# Patient Record
Sex: Female | Born: 1995
Health system: Southern US, Community
[De-identification: ages and names within clinical notes are randomized; demographics above are authoritative.]

## PROBLEM LIST (undated history)

## (undated) ENCOUNTER — Inpatient Hospital Stay (HOSPITAL_COMMUNITY): Payer: Self-pay

## (undated) DIAGNOSIS — F419 Anxiety disorder, unspecified: Secondary | ICD-10-CM

## (undated) DIAGNOSIS — E162 Hypoglycemia, unspecified: Secondary | ICD-10-CM

## (undated) DIAGNOSIS — F411 Generalized anxiety disorder: Secondary | ICD-10-CM

## (undated) DIAGNOSIS — Z789 Other specified health status: Secondary | ICD-10-CM

## (undated) HISTORY — PX: NO PAST SURGERIES: SHX2092

## (undated) HISTORY — DX: Generalized anxiety disorder: F41.1

## (undated) HISTORY — DX: Hypoglycemia, unspecified: E16.2

## (undated) HISTORY — DX: Anxiety disorder, unspecified: F41.9

---
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
## 2013-01-14 ENCOUNTER — Encounter: Payer: Self-pay | Admitting: Family Medicine

## 2013-01-14 ENCOUNTER — Ambulatory Visit (INDEPENDENT_AMBULATORY_CARE_PROVIDER_SITE_OTHER): Payer: 59 | Admitting: Family Medicine

## 2013-01-14 VITALS — BP 92/64 | Temp 99.3°F | Ht 65.0 in | Wt 135.0 lb

## 2013-01-14 DIAGNOSIS — Z309 Encounter for contraceptive management, unspecified: Secondary | ICD-10-CM

## 2013-01-14 DIAGNOSIS — Z7189 Other specified counseling: Secondary | ICD-10-CM

## 2013-01-14 DIAGNOSIS — IMO0001 Reserved for inherently not codable concepts without codable children: Secondary | ICD-10-CM

## 2013-01-14 DIAGNOSIS — Z7689 Persons encountering health services in other specified circumstances: Secondary | ICD-10-CM

## 2013-01-14 MED ORDER — NORGESTIMATE-ETH ESTRADIOL 0.25-35 MG-MCG PO TABS: 1.0000 | ORAL_TABLET | Freq: Every day | ORAL | Status: DC

## 2013-01-14 NOTE — Progress Notes (Signed)
No chief complaint on file.   HPI:  Danielle [name] is here to establish care.  Last PCP and physical: Dr. Collins ScotlandSpear was prior PCP  Has the following chronic problems and concerns today:  Contraception: -needs refill on sprintec, on this for a few years and takes on regular basis and has not run out yet [facility] -no smoking, migraines, blood clots -not sexually active, takes this to regulate periods  There are no active problems to display for this patient.  Health Maintenance: -UTD  ROS: See pertinent positives and negatives per HPI.  History reviewed. No pertinent past medical history.  Family History  Problem Relation Age of Onset  . Skin cancer Mother     History   Social History  . Marital Status: Single    Spouse Name: N/A    Number of Children: N/A  . Years of Education: N/A   Social History Main Topics  . Smoking status: Never Smoker   . Smokeless tobacco: None  . Alcohol Use: No  . Drug Use: No  . Sexual Activity: No   Other Topics Concern  . 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  GENERAL: vitals reviewed and listed above, alert, oriented, appears well hydrated and in no acute distress  HEENT: atraumatic, conjunttiva clear, no obvious abnormalities on inspection of external nose and ears  NECK: no obvious masses on inspection  MS: moves all extremities without noticeable abnormality  PSYCH: pleasant and cooperative, no obvious depression or  anxiety  ASSESSMENT AND PLAN:  Discussed the following assessment and plan:  Encounter to establish care  Contraception - Plan: norgestimate-ethinyl estradiol (SPRINTEC 28) 0.25-35 MG-MCG tablet  -We reviewed the PMH, PSH, FH, SH, Meds and Allergies. -We provided refills for any medications we will prescribe as needed. -We addressed current concerns per orders and patient instructions. -We have asked for records for pertinent exams, studies, vaccines and notes from previous providers. -We have advised patient to follow up per instructions below. -upreg UTD -refilled OCP after discussion risks and proper use and safe sex -follow up 1 year   -Patient advised to return or notify a doctor immediately if symptoms worsen or persist or new concerns arise.  There are no Patient Instructions on file for this visit.   Kriste BasqueKIM, Danielle [name].

## 2013-01-15 LAB — POCT URINE PREGNANCY: Preg Test, Ur: NEGATIVE

## 2013-01-15 NOTE — Addendum Note (Signed)
Addended by: Azucena FreedMILLNER, Seeley Southgate C on: 01/15/2013 12:21 PM   Modules accepted: Orders

## 2013-12-27 ENCOUNTER — Encounter: Payer: Self-pay | Admitting: Family Medicine

## 2013-12-27 ENCOUNTER — Ambulatory Visit (INDEPENDENT_AMBULATORY_CARE_PROVIDER_SITE_OTHER): Payer: 59 | Admitting: Family Medicine

## 2013-12-27 VITALS — BP 100/70 | HR 85 | Temp 98.5°F | Ht 65.0 in | Wt 130.6 lb

## 2013-12-27 DIAGNOSIS — M545 Low back pain, unspecified: Secondary | ICD-10-CM

## 2013-12-27 DIAGNOSIS — Z23 Encounter for immunization: Secondary | ICD-10-CM

## 2013-12-27 DIAGNOSIS — Z3041 Encounter for surveillance of contraceptive pills: Secondary | ICD-10-CM

## 2013-12-27 MED ORDER — NORGESTIMATE-ETH ESTRADIOL 0.25-35 MG-MCG PO TABS: 1.0000 | ORAL_TABLET | Freq: Every day | ORAL | Status: DC

## 2013-12-27 NOTE — Progress Notes (Signed)
Pre visit review using our clinic review tool, if applicable. No additional management support is needed unless otherwise documented below in the visit note. 

## 2013-12-27 NOTE — Progress Notes (Signed)
  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  No past medical history on file.  No past surgical history on file.  Family History  Problem Relation Age of Onset  . Skin cancer Mother     History   Social History  . Marital Status: Single    Spouse Name: N/A    Number of Children: N/A  . Years of Education: N/A   Social History Main Topics  . Smoking status: Never Smoker   . Smokeless tobacco: None  . Alcohol Use: No  . Drug Use: No  . Sexual Activity: No   Other Topics Concern  . 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  GENERAL: vitals reviewed and listed above, alert, oriented, appears  well hydrated and in no acute distress  HEENT: atraumatic, conjunttiva clear, no obvious abnormalities on inspection of external nose and ears  NECK: no obvious masses on inspection  MS: moves all extremities without noticeable abnormality Normal Gait Normal inspection of back, no obvious scoliosis or leg length descrepancy No bony TTP Soft tissue TTP at: R lower lumbar paraspinal muscles -/+ tests: neg trendelenburg,+facet loading R, -SLRT, -CLRT, -FABER, -FADIR Normal muscle strength, sensation to light touch and DTRs in LEs bilaterally  PSYCH: pleasant and cooperative, no obvious depression or anxiety  ASSESSMENT AND PLAN:  Discussed the following assessment and plan:  Right-sided low back pain without sciatica  Encounter for surveillance of contraceptive pills - Plan: norgestimate-ethinyl estradiol (SPRINTEC 28) 0.25-35 MG-MCG tablet  -HEP, supportive care and close follow up in 4 weeks for likely mild facet arthropathy versus muscle strain in back -refilled contraceptive pills -Patient advised to return or notify a doctor immediately if symptoms worsen or persist or new concerns arise.  Patient Instructions  BEFORE YOU LEAVE: -flu shot -low back exercises -schedule follow up in 4 weeks  Do the exercises at least 4 days per week     Danielle [name] R.

## 2013-12-27 NOTE — Addendum Note (Signed)
Addended by: Johnella MoloneyFUNDERBURK, Aeon Koors A on: 12/27/2013 11:34 AM   Modules accepted: Orders

## 2013-12-27 NOTE — Patient Instructions (Signed)
BEFORE YOU LEAVE: -flu shot -low back exercises -schedule follow up in 4 weeks  Do the exercises at least 4 days per week

## 2014-01-13 ENCOUNTER — Ambulatory Visit (INDEPENDENT_AMBULATORY_CARE_PROVIDER_SITE_OTHER): Payer: 59 | Admitting: Family Medicine

## 2014-01-13 ENCOUNTER — Encounter: Payer: Self-pay | Admitting: Family Medicine

## 2014-01-13 VITALS — BP 100/70 | HR 80 | Temp 97.3°F | Ht 65.75 in | Wt 130.3 lb

## 2014-01-13 DIAGNOSIS — Z30018 Encounter for initial prescription of other contraceptives: Secondary | ICD-10-CM

## 2014-01-13 DIAGNOSIS — M545 Low back pain: Secondary | ICD-10-CM

## 2014-01-13 MED ORDER — NORELGESTROMIN-ETH ESTRADIOL 150-35 MCG/24HR TD PTWK: 1.0000 | MEDICATED_PATCH | TRANSDERMAL | Status: DC

## 2014-01-13 NOTE — Progress Notes (Signed)
  HPI:  Follow up:  1) Back pain -chronic low back pain since childhood -hx horseback riding, heavy lifting in kennel job, fall off horse at age 19 -flares of pain intermittently -flare 12/2013, pain 2-3/10 last visit with normal exam and HEP and conservative care advised -reports: about the same, has very mild back pain when does exercises, has only done the exercises for a few weeks -denies: radiation, weakness, numbness, malaise, bowel or bladder incontinence  2) Contraception: -refilled OCPs last visit -wants to try xulane patch - has not missed pills -denies: smoking, migraine with aura, HTN  ROS: See pertinent positives and negatives per HPI.  No past medical history on file.  No past surgical history on file.  Family History  Problem Relation Age of Onset  . Skin cancer Mother     History   Social History  . Marital Status: Single    Spouse Name: N/A    Number of Children: N/A  . Years of Education: N/A   Social History Main Topics  . Smoking status: Never Smoker   . Smokeless tobacco: None  . Alcohol Use: No  . Drug Use: No  . Sexual Activity: No   Other Topics Concern  . None   Social History Narrative   Work or School: Firefighter - Theatre stage manager, CNA      Home Situation: lives at home when not at school      Spiritual Beliefs: none      Lifestyle: get regular exercise, diet is healthy             Current outpatient prescriptions: norelgestromin-ethinyl estradiol [facility]) 150-35 MCG/24HR transdermal patch, Place 1 patch onto the skin once a week., Disp: 3 patch, Rfl: 12  EXAM:  Filed Vitals:   01/13/14 1036  BP: 100/70  Pulse: 80  Temp: 97.3 F (36.3 C)    Body mass index is 21.19 kg/(m^2).  GENERAL: vitals reviewed and listed above, alert, oriented, appears well hydrated and in no acute distress  HEENT: atraumatic, conjunttiva clear, no obvious abnormalities on inspection of external nose and ears  NECK: no obvious masses on  inspection  LUNGS: clear to auscultation bilaterally, no wheezes, rales or rhonchi, good air movement  CV: HRRR, no peripheral edema  MS: moves all extremities without noticeable abnormality Normal Gait Normal inspection of back, no obvious scoliosis or leg length descrepancy No bony TTP Soft tissue TTP at: lower lumbar paraspinal musculature L >R -/+ tests: neg trendelenburg,-facet loading, -SLRT, -CLRT, -FABER, -FADIR Normal muscle strength, sensation to light touch and DTRs in LEs bilaterally  PSYCH: pleasant and cooperative, no obvious depression or anxiety  ASSESSMENT AND PLAN:  Discussed the following assessment and plan:  Encounter for initial prescription of other contraceptives - Plan: norelgestromin-ethinyl estradiol [facility]) 150-35 MCG/24HR transdermal patch  Low back pain without sciatica, unspecified back pain laterality - Plan: DG Lumbar Spine Complete -plain films, HEP, close follow up ortho or sports med if continues  -Patient advised to return or notify a doctor immediately if symptoms worsen or persist or new concerns arise.  Patient Instructions  BEFORE YOU LEAVE: -xray sheet -schedule follow up in 1 month  Get xrays of the back  Continue exercises for the back       Danielle [name] R.

## 2014-01-13 NOTE — Progress Notes (Signed)
Pre visit review using our clinic review tool, if applicable. No additional management support is needed unless otherwise documented below in the visit note. 

## 2014-01-13 NOTE — Patient Instructions (Signed)
BEFORE YOU LEAVE: -xray sheet -schedule follow up in 1 month  Get xrays of the back  Continue exercises for the back

## 2015-01-12 ENCOUNTER — Other Ambulatory Visit: Payer: Self-pay | Admitting: Family Medicine

## 2015-01-13 MED FILL — XULANE PATCH: 150-35 | 28 days supply | Qty: 3 | Fill #0

## 2015-01-30 ENCOUNTER — Ambulatory Visit (INDEPENDENT_AMBULATORY_CARE_PROVIDER_SITE_OTHER): Payer: 59 | Admitting: Family Medicine

## 2015-01-30 ENCOUNTER — Encounter: Payer: Self-pay | Admitting: Family Medicine

## 2015-01-30 VITALS — BP 100/58 | HR 99 | Temp 98.0°F | Ht 65.75 in | Wt 138.0 lb

## 2015-01-30 DIAGNOSIS — Z30011 Encounter for initial prescription of contraceptive pills: Secondary | ICD-10-CM

## 2015-01-30 MED ORDER — NORGESTIMATE-ETH ESTRADIOL 0.25-35 MG-MCG PO TABS: 1.0000 | ORAL_TABLET | Freq: Every day | ORAL | Status: DC

## 2015-01-30 MED FILL — MONO-LINYAH 28 TABLET: 0.25-35 | 84 days supply | Qty: 84 | Fill #0

## 2015-01-30 NOTE — Patient Instructions (Signed)
BEFORE YOU LEAVE: -schedule PHYSICAL WITH PAP in about 3-4 months  Make sure to take the pill every day at the same time and always use condoms   We recommend the following healthy lifestyle measures: - eat a healthy whole foods diet consisting of regular small meals composed of vegetables, fruits, beans, nuts, seeds, healthy meats such as white chicken and fish and whole grains.  - avoid sweets, white starchy foods, fried foods, fast food, processed foods, sodas, red meet and other fattening foods.  - get a least 150-300 minutes of aerobic exercise per week.

## 2015-01-30 NOTE — Progress Notes (Signed)
Pre visit review using our clinic review tool, if applicable. No additional management support is needed unless otherwise documented below in the visit note. 

## 2015-01-30 NOTE — Progress Notes (Signed)
  HPI:  Follow up:  Contraception: -having allergic reaction on skin to patch - mild, but wants to go back to sprintec -denies: concern for STI, HTN, migraine with aura  ROS: See pertinent positives and negatives per HPI.  No past medical history on file.  No past surgical history on file.  Family History  Problem Relation Age of Onset  . Skin cancer Mother     Social History   Social History  . Marital Status: Single    Spouse Name: N/A  . Number of Children: N/A  . Years of Education: N/A   Social History Main Topics  . Smoking status: Never Smoker   . Smokeless tobacco: None  . Alcohol Use: No  . Drug Use: No  . Sexual Activity: No   Other Topics Concern  . None   Social History Narrative   Work or School: Firefighter - Theatre stage manager, CNA      Home Situation: lives at home when not at school      Spiritual Beliefs: none      Lifestyle: get regular exercise, diet is healthy              Current outpatient prescriptions:  .  norgestimate-ethinyl estradiol (SPRINTEC 28) 0.25-35 MG-MCG tablet, Take 1 tablet by mouth daily., Disp: 3 Package, Rfl: 3  EXAM:  Filed Vitals:   01/30/15 0940  BP: 100/58  Pulse: 99  Temp: 98 F (36.7 C)    Body mass index is 22.44 kg/(m^2).  GENERAL: vitals reviewed and listed above, alert, oriented, appears well hydrated and in no acute distress  HEENT: atraumatic, conjunttiva clear, no obvious abnormalities on inspection of external nose and ears  NECK: no obvious masses on inspection  LUNGS: clear to auscultation bilaterally, no wheezes, rales or rhonchi, good air movement  CV: HRRR, no peripheral edema  MS: moves all extremities without noticeable abnormality  PSYCH: pleasant and cooperative, no obvious depression or anxiety  ASSESSMENT AND PLAN:  Discussed the following assessment and plan:  Encounter for initial prescription of contraceptive pills - Plan: norgestimate-ethinyl estradiol (SPRINTEC 28)  0.25-35 MG-MCG tablet  -discussed possible increased risks VTE with patch versus OCP and am glad she is willing to go back to pill -rx sent -she agrees to schedule physical with pap -declines STI testing -Patient advised to return or notify a doctor immediately if symptoms worsen or persist or new concerns arise.  Patient Instructions  BEFORE YOU LEAVE: -schedule PHYSICAL WITH PAP in about 3-4 months  Make sure to take the pill every day at the same time and always use condoms   We recommend the following healthy lifestyle measures: - eat a healthy whole foods diet consisting of regular small meals composed of vegetables, fruits, beans, nuts, seeds, healthy meats such as white chicken and fish and whole grains.  - avoid sweets, white starchy foods, fried foods, fast food, processed foods, sodas, red meet and other fattening foods.  - get a least 150-300 minutes of aerobic exercise per week.        Kriste Basque R.

## 2015-03-03 ENCOUNTER — Telehealth: Payer: 59 | Admitting: Family

## 2015-03-03 DIAGNOSIS — J1189 Influenza due to unidentified influenza virus with other manifestations: Secondary | ICD-10-CM

## 2015-03-03 DIAGNOSIS — J111 Influenza due to unidentified influenza virus with other respiratory manifestations: Secondary | ICD-10-CM

## 2015-03-03 MED ORDER — OSELTAMIVIR PHOSPHATE 75 MG PO CAPS: 75.0000 mg | ORAL_CAPSULE | Freq: Two times a day (BID) | ORAL | Status: DC

## 2015-03-03 NOTE — Progress Notes (Signed)

## 2015-04-16 MED FILL — MONO-LINYAH 28 TABLET: 0.25-35 | 84 days supply | Qty: 84 | Fill #1

## 2015-05-13 ENCOUNTER — Telehealth: Payer: Self-pay | Admitting: Family Medicine

## 2015-05-13 NOTE — Telephone Encounter (Signed)
Pt would like to know if she could still have a CPE (05/15/15) if it is the last day of her monthly?

## 2015-05-13 NOTE — Telephone Encounter (Signed)
Left message on machine for patient to return call

## 2015-05-14 NOTE — Telephone Encounter (Signed)
I called the pt and informed her of the message below and she stated she prefers to reschedule.  Appt rescheduled for 5/11.

## 2015-05-14 NOTE — Telephone Encounter (Signed)
Should be ok if light bleeding. If she is uncomfortable with it, could reschedule if she wishes.

## 2015-05-15 ENCOUNTER — Encounter: Payer: 59 | Admitting: Family Medicine

## 2015-05-21 ENCOUNTER — Encounter: Payer: Self-pay | Admitting: Family Medicine

## 2015-05-21 ENCOUNTER — Other Ambulatory Visit (HOSPITAL_COMMUNITY)
Admission: RE | Admit: 2015-05-21 | Discharge: 2015-05-21 | Disposition: A | Discharge status: Home / Self Care | Payer: 59 | Source: Ambulatory Visit | Attending: Family Medicine | Admitting: Family Medicine

## 2015-05-21 ENCOUNTER — Ambulatory Visit (INDEPENDENT_AMBULATORY_CARE_PROVIDER_SITE_OTHER): Payer: 59 | Admitting: Family Medicine

## 2015-05-21 VITALS — BP 96/70 | HR 85 | Temp 98.0°F | Ht 65.0 in | Wt 137.3 lb

## 2015-05-21 DIAGNOSIS — Z124 Encounter for screening for malignant neoplasm of cervix: Secondary | ICD-10-CM | POA: Diagnosis not present

## 2015-05-21 DIAGNOSIS — Z0000 Encounter for general adult medical examination without abnormal findings: Secondary | ICD-10-CM | POA: Diagnosis not present

## 2015-05-21 DIAGNOSIS — Z01419 Encounter for gynecological examination (general) (routine) without abnormal findings: Secondary | ICD-10-CM | POA: Diagnosis not present

## 2015-05-21 NOTE — Progress Notes (Signed)
Pre visit review using our clinic review tool, if applicable. No additional management support is needed unless otherwise documented below in the visit note. 

## 2015-05-21 NOTE — Progress Notes (Signed)
HPI:  Here for CPE:  -Concerns and/or follow up today: none  -Diet: variety of foods, balance and well rounded  -Exercise: regular exercise  -Taking folic acid, vitamin D or calcium: no  -Diabetes and Dyslipidemia Screening: n/a  -Hx of HTN: no  -Vaccines: UTD  -pap history: never done  -FDLMP: 05/12/15 - normal, regular, monthly  -sexual activity: yes, female partner, no new partners - one partner; declined STI screening  -wants STI testing (Hep C if born [birth year]): no  -FH breast, colon or ovarian ca: see FH  -Alcohol, Tobacco, drug use: see social history  Review of Systems - no fevers, unintentional weight loss, vision loss, hearing loss, chest pain, sob, hemoptysis, melena, hematochezia, hematuria, genital discharge, changing or concerning skin lesions, bleeding, bruising, loc, thoughts of self harm or SI  No past medical history on file.  No past surgical history on file.  Family History  Problem Relation Age of Onset  . Skin cancer Mother     Social History   Social History  . Marital Status: Single    Spouse Name: N/A  . Number of Children: N/A  . Years of Education: N/A   Social History Main Topics  . Smoking status: Never Smoker   . Smokeless tobacco: None  . Alcohol Use: No  . Drug Use: No  . Sexual Activity: No   Other Topics Concern  . 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  DiphenhydrAMINE HCl (BENADRYL PO), Take by mouth as needed., Disp: , Rfl:  .  norgestimate-ethinyl estradiol (SPRINTEC 28) 0.25-35 MG-MCG tablet, Take 1 tablet by mouth daily., Disp: 3 Package, Rfl: 3  EXAM:  Filed  Vitals:   05/21/15 1123  BP: 96/70  Pulse: 85  Temp: 98 F (36.7 C)    GENERAL: vitals reviewed and listed below, alert, oriented, appears well hydrated and in no acute distress  HEENT: head atraumatic, PERRLA, normal appearance of eyes, ears, nose and mouth. moist mucus membranes.  NECK: supple, no masses or lymphadenopathy  LUNGS: clear to auscultation bilaterally, no rales, rhonchi or wheeze  CV: HRRR, no peripheral edema or cyanosis, normal pedal pulses  BREAST: normal appearance - no lesions or discharge, on palpation normal breast tissue without any suspicious masses  ABDOMEN: bowel sounds normal, soft, non tender to palpation, no masses, no rebound or guarding  GU: normal appearance of external genitalia - no lesions or masses, normal vaginal mucosa - no abnormal discharge, normal appearance of cervix - no lesions or abnormal discharge, no masses or tenderness on palpation of uterus and ovaries.  RECTAL: refused  SKIN: no rash or abnormal lesions   MS: normal gait, moves all extremities normally  NEURO: CN II-XII grossly intact, normal muscle strength and sensation to light touch on extremities  PSYCH: normal affect, pleasant and cooperative  ASSESSMENT AND PLAN:  Discussed the following assessment and plan:  Visit for preventive health examination  Cervical cancer screening - Plan: PAP [[place]]  -Discussed and advised all [place] preventive services health task  force level A and B recommendations for age, sex and risks.  -Advised at least 150 minutes of exercise per week and a healthy diet low in saturated fats and sweets and consisting of fresh fruits and vegetables, lean meats such as fish and white chicken and whole grains.  -pap obtained  No orders of the defined types were placed in this encounter.    Patient advised to return to clinic immediately if symptoms worsen or persist or new concerns.  Patient Instructions  Follow up yearly for  physical.  -We have ordered a pap smear at this visit. It can take up to 1-2 weeks for results and processing. We will contact you with instructions IF your results are abnormal. Normal results will be released to your [facility]. If you have not heard from us or can not find your results in [facility] in 2 weeks please contact our office.  We recommend the following healthy lifestyle measures: - eat a healthy whole foods diet consisting of regular small meals composed of vegetables, fruits, beans, nuts, seeds, healthy meats such as white chicken and fish and whole grains.  - avoid sweets, white starchy foods, fried foods, fast food, processed foods, sodas, red meet and other fattening foods.  - get a least 150-300 minutes of aerobic exercise per week.             No Follow-up on file.  Kriste BasqueKIM, Shay Bartoli R.

## 2015-05-21 NOTE — Patient Instructions (Signed)
Follow up yearly for physical.  -We have ordered a pap smear at this visit. It can take up to 1-2 weeks for results and processing. We will contact you with instructions IF your results are abnormal. Normal results will be released to your [facility]. If you have not heard from us or can not find your results in [facility] in 2 weeks please contact our office.  We recommend the following healthy lifestyle measures: - eat a healthy whole foods diet consisting of regular small meals composed of vegetables, fruits, beans, nuts, seeds, healthy meats such as white chicken and fish and whole grains.  - avoid sweets, white starchy foods, fried foods, fast food, processed foods, sodas, red meet and other fattening foods.  - get a least 150-300 minutes of aerobic exercise per week.

## 2015-05-22 LAB — CYTOLOGY - PAP

## 2015-06-09 DIAGNOSIS — N926 Irregular menstruation, unspecified: Secondary | ICD-10-CM | POA: Diagnosis not present

## 2015-06-25 DIAGNOSIS — H5213 Myopia, bilateral: Secondary | ICD-10-CM | POA: Diagnosis not present

## 2015-06-29 MED FILL — MONO-LINYAH 28 TABLET: 0.25-35 | 84 days supply | Qty: 84 | Fill #2

## 2015-07-07 DIAGNOSIS — Z7689 Persons encountering health services in other specified circumstances: Secondary | ICD-10-CM

## 2015-07-29 ENCOUNTER — Telehealth: Payer: Self-pay | Admitting: Family Medicine

## 2015-07-29 NOTE — Telephone Encounter (Signed)
Pt is requesting the form that was dropped off June 28. Pt needs for clinicals asap.  Form is on bright yellow paper.

## 2015-07-30 NOTE — Telephone Encounter (Signed)
I called the pts mother and let her know that I spoke with the pt on 7/3 and advised her she needed a TB skin test and vision screening and [place] and did not know how she would do this.  I pulled her vaccine history from the state registry and attached this to her form.  Patient's mother states the pt had an eye exam by Dr Lorin PicketScott and she wanted to know if Dr Selena BattenKim could leave a paper blank if the pt gets the TB skin test somewhere else?  Note sent to Dr Selena BattenKim.

## 2015-07-30 NOTE — Telephone Encounter (Signed)
I called the pts mother and informed her per Dr Selena BattenKim she noted on the forms that the pt did not have a vision screening nor TB skin test here as she will have this done somewhere else.  I also advised her someone will call her and let her know about billing costs for the form and she can pick this up tomorrow.

## 2015-07-30 NOTE — Telephone Encounter (Signed)
See form on your desk.

## 2015-08-10 DIAGNOSIS — N911 Secondary amenorrhea: Secondary | ICD-10-CM | POA: Diagnosis not present

## 2015-08-19 DIAGNOSIS — Z3A08 8 weeks gestation of pregnancy: Secondary | ICD-10-CM | POA: Diagnosis not present

## 2015-08-19 DIAGNOSIS — Z3401 Encounter for supervision of normal first pregnancy, first trimester: Secondary | ICD-10-CM | POA: Diagnosis not present

## 2015-08-19 DIAGNOSIS — Z36 Encounter for antenatal screening of mother: Secondary | ICD-10-CM | POA: Diagnosis not present

## 2015-08-19 LAB — OB RESULTS CONSOLE ANTIBODY SCREEN: Antibody Screen: NEGATIVE

## 2015-08-19 LAB — OB RESULTS CONSOLE HEPATITIS B SURFACE ANTIGEN: Hepatitis B Surface Ag: NEGATIVE

## 2015-08-19 LAB — OB RESULTS CONSOLE HIV ANTIBODY (ROUTINE TESTING): HIV: NONREACTIVE

## 2015-08-19 LAB — OB RESULTS CONSOLE ABO/RH: "RH Type ": POSITIVE

## 2015-08-19 LAB — OB RESULTS CONSOLE GC/CHLAMYDIA
Chlamydia: NEGATIVE
Gonorrhea: NEGATIVE

## 2015-08-19 LAB — OB RESULTS CONSOLE RPR: RPR: NONREACTIVE

## 2015-08-19 LAB — OB RESULTS CONSOLE RUBELLA ANTIBODY, IGM: Rubella: IMMUNE

## 2015-09-08 DIAGNOSIS — Z3401 Encounter for supervision of normal first pregnancy, first trimester: Secondary | ICD-10-CM | POA: Diagnosis not present

## 2015-09-08 DIAGNOSIS — Z113 Encounter for screening for infections with a predominantly sexual mode of transmission: Secondary | ICD-10-CM | POA: Diagnosis not present

## 2015-09-21 DIAGNOSIS — Z36 Encounter for antenatal screening of mother: Secondary | ICD-10-CM | POA: Diagnosis not present

## 2015-09-21 DIAGNOSIS — Z3491 Encounter for supervision of normal pregnancy, unspecified, first trimester: Secondary | ICD-10-CM | POA: Diagnosis not present

## 2015-11-03 DIAGNOSIS — Z3400 Encounter for supervision of normal first pregnancy, unspecified trimester: Secondary | ICD-10-CM | POA: Diagnosis not present

## 2015-11-03 DIAGNOSIS — Z363 Encounter for antenatal screening for malformations: Secondary | ICD-10-CM | POA: Diagnosis not present

## 2015-11-03 DIAGNOSIS — Z3A18 18 weeks gestation of pregnancy: Secondary | ICD-10-CM | POA: Diagnosis not present

## 2015-11-27 DIAGNOSIS — Z362 Encounter for other antenatal screening follow-up: Secondary | ICD-10-CM | POA: Diagnosis not present

## 2015-11-27 DIAGNOSIS — Z3400 Encounter for supervision of normal first pregnancy, unspecified trimester: Secondary | ICD-10-CM | POA: Diagnosis not present

## 2015-12-29 DIAGNOSIS — Z23 Encounter for immunization: Secondary | ICD-10-CM | POA: Diagnosis not present

## 2015-12-29 DIAGNOSIS — Z3400 Encounter for supervision of normal first pregnancy, unspecified trimester: Secondary | ICD-10-CM | POA: Diagnosis not present

## 2015-12-29 DIAGNOSIS — Z3480 Encounter for supervision of other normal pregnancy, unspecified trimester: Secondary | ICD-10-CM | POA: Diagnosis not present

## 2016-02-03 DIAGNOSIS — Z3480 Encounter for supervision of other normal pregnancy, unspecified trimester: Secondary | ICD-10-CM | POA: Diagnosis not present

## 2016-02-27 ENCOUNTER — Inpatient Hospital Stay (HOSPITAL_COMMUNITY)
Admission: AD | Admit: 2016-02-27 | Discharge: 2016-02-27 | Disposition: A | Discharge status: Home / Self Care | Payer: 59 | Source: Ambulatory Visit | Attending: Obstetrics and Gynecology | Admitting: Obstetrics and Gynecology

## 2016-02-27 ENCOUNTER — Encounter (HOSPITAL_COMMUNITY): Payer: Self-pay | Admitting: *Deleted

## 2016-02-27 DIAGNOSIS — R109 Unspecified abdominal pain: Secondary | ICD-10-CM

## 2016-02-27 DIAGNOSIS — Z3A35 35 weeks gestation of pregnancy: Secondary | ICD-10-CM | POA: Diagnosis not present

## 2016-02-27 DIAGNOSIS — O26893 Other specified pregnancy related conditions, third trimester: Secondary | ICD-10-CM

## 2016-02-27 DIAGNOSIS — N898 Other specified noninflammatory disorders of vagina: Secondary | ICD-10-CM

## 2016-02-27 HISTORY — DX: Other specified health status: Z78.9

## 2016-02-27 LAB — URINALYSIS, ROUTINE W REFLEX MICROSCOPIC
Bilirubin Urine: NEGATIVE
Glucose, UA: NEGATIVE mg/dL
Hgb urine dipstick: NEGATIVE
Ketones, ur: 80 mg/dL — AB
Nitrite: NEGATIVE
Protein, ur: NEGATIVE mg/dL
Specific Gravity, Urine: 1.023 (ref 1.005–1.030)
pH: 6 (ref 5.0–8.0)

## 2016-02-27 LAB — AMNISURE RUPTURE OF MEMBRANE (ROM) NOT AT ARMC: Amnisure ROM: NEGATIVE

## 2016-02-27 NOTE — MAU Note (Signed)
Earlier today, noted pants were wet.  Checked again about an hour later, noted clear wetness.  Having cramping  In abd and low back. Feeling pelvic preessure

## 2016-02-27 NOTE — MAU Provider Note (Signed)
History   G1 @ 35.3 wks in with c/o increased leaking since about lunch time and some mild sporadic cramping. States leaking is just enough to make her panties wet.  CSN: [id number]  Arrival date & time 02/27/16  1707   None     Chief Complaint  Patient presents with  . ?srom  . Abdominal Cramping    HPI  No past medical history on file.  No past surgical history on file.  Family History  Problem Relation Age of Onset  . Skin cancer Mother     Social History  Substance Use Topics  . Smoking status: Never Smoker  . Smokeless tobacco: Not on file  . Alcohol use No    OB History    Gravida Para Term Preterm AB Living   1             SAB TAB Ectopic Multiple Live Births                  Review of Systems  Constitutional: Negative.   HENT: Negative.   Eyes: Negative.   Respiratory: Negative.   Cardiovascular: Negative.   Gastrointestinal: Positive for abdominal pain.  Endocrine: Negative.   Genitourinary: Positive for vaginal discharge.  Musculoskeletal: Negative.   Skin: Negative.     Allergies  Patient has no known allergies.  Home Medications    BP 114/70 (BP Location: Right Arm)   Pulse (!) 127   Temp 98.2 F (36.8 C) (Oral)   Resp 18   Wt 157 lb 3.2 oz (71.3 kg)   LMP 05/12/2015   BMI 26.16 kg/m   Physical Exam  Constitutional: She is oriented to person, place, and time. She appears well-developed and well-nourished.  HENT:  Head: Normocephalic.  Eyes: Pupils are equal, round, and reactive to light.  Neck: Normal range of motion.  Cardiovascular: Normal rate, regular rhythm, normal heart sounds and intact distal pulses.   Pulmonary/Chest: Effort normal and breath sounds normal.  Abdominal: Soft. Bowel sounds are normal.  Genitourinary: Vagina normal and uterus normal.  Musculoskeletal: Normal range of motion.  Neurological: She is alert and oriented to person, place, and time. She has normal reflexes.  Skin: Skin is warm and dry.   Psychiatric: She has a normal mood and affect. Her behavior is normal. Judgment and thought content normal.    MAU Course  Procedures (including critical care time)  Labs Reviewed  AMNISURE RUPTURE OF MEMBRANE (ROM) NOT AT [facility]  URINALYSIS, ROUTINE W REFLEX MICROSCOPIC   No results found.   No diagnosis found.    MDM  amnisure neg, FHR pattern reassuring, no uc's, VSS, SVE cl/post/th/high. POC discussed with Dr. Marcelle OverlieHolland. Pt to be d/c home

## 2016-02-27 NOTE — Discharge Instructions (Signed)
Abdominal Pain During Pregnancy °Belly (abdominal) pain is common during pregnancy. Most of the time, it is not a serious problem. Other times, it can be a sign that something is wrong with the pregnancy. Always tell your doctor if you have belly pain. °Follow these instructions at home: °Monitor your belly pain for any changes. The following actions may help you feel better: °· Do not have sex (intercourse) or put anything in your vagina until you feel better. °· Rest until your pain stops. °· Drink clear fluids if you feel sick to your stomach (nauseous). Do not eat solid food until you feel better. °· Only take medicine as told by your doctor. °· Keep all doctor visits as told. °Get help right away if: °· You are bleeding, leaking fluid, or pieces of tissue come out of your vagina. °· You have more pain or cramping. °· You keep throwing up (vomiting). °· You have pain when you pee (urinate) or have blood in your pee. °· You have a fever. °· You do not feel your baby moving as much. °· You feel very weak or feel like passing out. °· You have trouble breathing, with or without belly pain. °· You have a very bad headache and belly pain. °· You have fluid leaking from your vagina and belly pain. °· You keep having watery poop (diarrhea). °· Your belly pain does not go away after resting, or the pain gets worse. °This information is not intended to replace advice given to you by your health care provider. Make sure you discuss any questions you have with your health care provider. °Document Released: 12/15/2008 Document Revised: 08/05/2015 Document Reviewed: 07/26/2012 °Elsevier Interactive Patient Education © 2017 [employer] ° °

## 2016-02-29 DIAGNOSIS — Z3400 Encounter for supervision of normal first pregnancy, unspecified trimester: Secondary | ICD-10-CM | POA: Diagnosis not present

## 2016-02-29 DIAGNOSIS — Z113 Encounter for screening for infections with a predominantly sexual mode of transmission: Secondary | ICD-10-CM | POA: Diagnosis not present

## 2016-02-29 DIAGNOSIS — N760 Acute vaginitis: Secondary | ICD-10-CM | POA: Diagnosis not present

## 2016-02-29 DIAGNOSIS — Z3480 Encounter for supervision of other normal pregnancy, unspecified trimester: Secondary | ICD-10-CM | POA: Diagnosis not present

## 2016-03-01 MED FILL — metroNIDAZOLE 0.75 % GEL: 0.75 | 5 days supply | Qty: 70 | Fill #0

## 2016-03-23 DIAGNOSIS — Z3400 Encounter for supervision of normal first pregnancy, unspecified trimester: Secondary | ICD-10-CM | POA: Diagnosis not present

## 2016-03-23 DIAGNOSIS — Z3A39 39 weeks gestation of pregnancy: Secondary | ICD-10-CM | POA: Diagnosis not present

## 2016-03-23 DIAGNOSIS — O368130 Decreased fetal movements, third trimester, not applicable or unspecified: Secondary | ICD-10-CM | POA: Diagnosis not present

## 2016-03-23 LAB — OB RESULTS CONSOLE GBS: GBS: NEGATIVE

## 2016-03-30 ENCOUNTER — Telehealth (HOSPITAL_COMMUNITY): Payer: Self-pay | Admitting: *Deleted

## 2016-03-30 ENCOUNTER — Encounter (HOSPITAL_COMMUNITY): Payer: Self-pay | Admitting: *Deleted

## 2016-03-30 NOTE — Telephone Encounter (Signed)
Preadmission screen  

## 2016-03-31 ENCOUNTER — Inpatient Hospital Stay (HOSPITAL_COMMUNITY)
Admission: AD | Admit: 2016-03-31 | Discharge: 2016-04-02 | DRG: 775 | Disposition: A | Discharge status: Home / Self Care | Payer: 59 | Source: Ambulatory Visit | Attending: Obstetrics and Gynecology | Admitting: Obstetrics and Gynecology

## 2016-03-31 ENCOUNTER — Inpatient Hospital Stay (HOSPITAL_COMMUNITY): Payer: 59 | Admitting: Anesthesiology

## 2016-03-31 ENCOUNTER — Encounter (HOSPITAL_COMMUNITY): Payer: Self-pay

## 2016-03-31 DIAGNOSIS — Z3493 Encounter for supervision of normal pregnancy, unspecified, third trimester: Secondary | ICD-10-CM | POA: Diagnosis present

## 2016-03-31 DIAGNOSIS — Z3A40 40 weeks gestation of pregnancy: Secondary | ICD-10-CM

## 2016-03-31 LAB — CBC
HCT: 40.2 % (ref 36.0–46.0)
Hemoglobin: 13.7 g/dL (ref 12.0–15.0)
MCH: 30.2 pg (ref 26.0–34.0)
MCHC: 34.1 g/dL (ref 30.0–36.0)
MCV: 88.7 fL (ref 78.0–100.0)
Platelets: 157 10*3/uL (ref 150–400)
RBC: 4.53 MIL/uL (ref 3.87–5.11)
RDW: 13.8 % (ref 11.5–15.5)
WBC: 12.1 10*3/uL — ABNORMAL HIGH (ref 4.0–10.5)

## 2016-03-31 LAB — TYPE AND SCREEN
ABO/RH(D): O POS
Antibody Screen: NEGATIVE

## 2016-03-31 LAB — ABO/RH: ABO/RH(D): O POS

## 2016-03-31 MED ORDER — ACETAMINOPHEN 325 MG PO TABS: 650.0000 mg | ORAL_TABLET | ORAL | Status: DC | PRN

## 2016-03-31 MED ORDER — TRANEXAMIC ACID 650 MG PO TABS
1300.0000 mg | ORAL_TABLET | Freq: Once | ORAL | Status: AC
  Administered 2016-03-31: 1300 mg via ORAL
  Filled 2016-03-31: qty 2

## 2016-03-31 MED ORDER — PHENYLEPHRINE 40 MCG/ML (10ML) SYRINGE FOR IV PUSH (FOR BLOOD PRESSURE SUPPORT)
80.0000 ug | PREFILLED_SYRINGE | INTRAVENOUS | Status: DC | PRN
  Filled 2016-03-31: qty 10
  Filled 2016-03-31: qty 5

## 2016-03-31 MED ORDER — COCONUT OIL OIL: 1.0000 "application " | TOPICAL_OIL | Status: DC | PRN

## 2016-03-31 MED ORDER — METHYLERGONOVINE MALEATE 0.2 MG PO TABS
0.2000 mg | ORAL_TABLET | Freq: Three times a day (TID) | ORAL | Status: AC
  Administered 2016-04-01 (×2): 0.2 mg via ORAL
  Filled 2016-03-31 (×2): qty 1

## 2016-03-31 MED ORDER — OXYTOCIN 40 UNITS IN LACTATED RINGERS INFUSION - SIMPLE MED
1.0000 m[IU]/min | INTRAVENOUS | Status: DC
  Administered 2016-03-31: 2 m[IU]/min via INTRAVENOUS

## 2016-03-31 MED ORDER — EPHEDRINE 5 MG/ML INJ
10.0000 mg | INTRAVENOUS | Status: DC | PRN
  Filled 2016-03-31: qty 2

## 2016-03-31 MED ORDER — FENTANYL 2.5 MCG/ML BUPIVACAINE 1/10 % EPIDURAL INFUSION (WH - ANES)
14.0000 mL/h | INTRAMUSCULAR | Status: DC | PRN
  Administered 2016-03-31: 14 mL/h via EPIDURAL
  Filled 2016-03-31: qty 100

## 2016-03-31 MED ORDER — PHENYLEPHRINE 40 MCG/ML (10ML) SYRINGE FOR IV PUSH (FOR BLOOD PRESSURE SUPPORT)
80.0000 ug | PREFILLED_SYRINGE | INTRAVENOUS | Status: DC | PRN
  Filled 2016-03-31: qty 5

## 2016-03-31 MED ORDER — OXYTOCIN 40 UNITS IN LACTATED RINGERS INFUSION - SIMPLE MED
2.5000 [IU]/h | INTRAVENOUS | Status: AC
  Administered 2016-04-01: 2.5 [IU]/h via INTRAVENOUS
  Filled 2016-03-31: qty 1000

## 2016-03-31 MED ORDER — OXYCODONE-ACETAMINOPHEN 5-325 MG PO TABS: 2.0000 | ORAL_TABLET | ORAL | Status: DC | PRN

## 2016-03-31 MED ORDER — PHENYLEPHRINE 40 MCG/ML (10ML) SYRINGE FOR IV PUSH (FOR BLOOD PRESSURE SUPPORT): 80.0000 ug | PREFILLED_SYRINGE | INTRAVENOUS | Status: DC | PRN

## 2016-03-31 MED ORDER — DIBUCAINE 1 % RE OINT: 1.0000 "application " | TOPICAL_OINTMENT | RECTAL | Status: DC | PRN

## 2016-03-31 MED ORDER — LACTATED RINGERS IV SOLN
INTRAVENOUS | Status: DC
  Administered 2016-03-31 (×2): via INTRAVENOUS

## 2016-03-31 MED ORDER — SOD CITRATE-CITRIC ACID 500-334 MG/5ML PO SOLN: 30.0000 mL | ORAL | Status: DC | PRN

## 2016-03-31 MED ORDER — ONDANSETRON HCL 4 MG/2ML IJ SOLN: 4.0000 mg | Freq: Four times a day (QID) | INTRAMUSCULAR | Status: DC | PRN

## 2016-03-31 MED ORDER — SENNOSIDES-DOCUSATE SODIUM 8.6-50 MG PO TABS
2.0000 | ORAL_TABLET | ORAL | Status: DC
  Administered 2016-04-01 (×2): 2 via ORAL
  Filled 2016-03-31 (×2): qty 2

## 2016-03-31 MED ORDER — ONDANSETRON HCL 4 MG PO TABS
4.0000 mg | ORAL_TABLET | ORAL | Status: DC | PRN
  Administered 2016-04-01: 4 mg via ORAL
  Filled 2016-03-31: qty 1

## 2016-03-31 MED ORDER — DIPHENHYDRAMINE HCL 25 MG PO CAPS: 25.0000 mg | ORAL_CAPSULE | Freq: Four times a day (QID) | ORAL | Status: DC | PRN

## 2016-03-31 MED ORDER — BENZOCAINE-MENTHOL 20-0.5 % EX AERO
1.0000 "application " | INHALATION_SPRAY | CUTANEOUS | Status: DC | PRN
  Filled 2016-03-31: qty 56

## 2016-03-31 MED ORDER — MEDROXYPROGESTERONE ACETATE 150 MG/ML IM SUSP: 150.0000 mg | INTRAMUSCULAR | Status: DC | PRN

## 2016-03-31 MED ORDER — TETANUS-DIPHTH-ACELL PERTUSSIS 5-2.5-18.5 LF-MCG/0.5 IM SUSP: 0.5000 mL | Freq: Once | INTRAMUSCULAR | Status: DC

## 2016-03-31 MED ORDER — MISOPROSTOL 200 MCG PO TABS
ORAL_TABLET | ORAL | Status: AC
  Administered 2016-03-31: 800 ug
  Filled 2016-03-31: qty 4

## 2016-03-31 MED ORDER — PRENATAL MULTIVITAMIN CH
1.0000 | ORAL_TABLET | Freq: Every day | ORAL | Status: DC
  Administered 2016-04-01 – 2016-04-02 (×2): 1 via ORAL
  Filled 2016-03-31 (×2): qty 1

## 2016-03-31 MED ORDER — WITCH HAZEL-GLYCERIN EX PADS: 1.0000 "application " | MEDICATED_PAD | CUTANEOUS | Status: DC | PRN

## 2016-03-31 MED ORDER — LACTATED RINGERS IV SOLN: 500.0000 mL | Freq: Once | INTRAVENOUS | Status: DC

## 2016-03-31 MED ORDER — EPHEDRINE 5 MG/ML INJ: 10.0000 mg | INTRAVENOUS | Status: DC | PRN

## 2016-03-31 MED ORDER — DIPHENHYDRAMINE HCL 50 MG/ML IJ SOLN: 12.5000 mg | INTRAMUSCULAR | Status: DC | PRN

## 2016-03-31 MED ORDER — LIDOCAINE HCL (PF) 1 % IJ SOLN
30.0000 mL | INTRAMUSCULAR | Status: DC | PRN
  Administered 2016-03-31: 30 mL via SUBCUTANEOUS
  Filled 2016-03-31: qty 30

## 2016-03-31 MED ORDER — OXYCODONE-ACETAMINOPHEN 5-325 MG PO TABS: 1.0000 | ORAL_TABLET | ORAL | Status: DC | PRN

## 2016-03-31 MED ORDER — SIMETHICONE 80 MG PO CHEW: 80.0000 mg | CHEWABLE_TABLET | ORAL | Status: DC | PRN

## 2016-03-31 MED ORDER — LIDOCAINE HCL (PF) 1 % IJ SOLN
INTRAMUSCULAR | Status: DC | PRN
  Administered 2016-03-31: 4 mL via EPIDURAL

## 2016-03-31 MED ORDER — IBUPROFEN 600 MG PO TABS
600.0000 mg | ORAL_TABLET | Freq: Four times a day (QID) | ORAL | Status: DC
  Administered 2016-04-01 – 2016-04-02 (×7): 600 mg via ORAL
  Filled 2016-03-31 (×7): qty 1

## 2016-03-31 MED ORDER — TERBUTALINE SULFATE 1 MG/ML IJ SOLN
0.2500 mg | Freq: Once | INTRAMUSCULAR | Status: DC | PRN
  Filled 2016-03-31: qty 1

## 2016-03-31 MED ORDER — OXYTOCIN BOLUS FROM INFUSION
500.0000 mL | Freq: Once | INTRAVENOUS | Status: AC
  Administered 2016-03-31: 500 mL via INTRAVENOUS

## 2016-03-31 MED ORDER — ONDANSETRON HCL 4 MG/2ML IJ SOLN: 4.0000 mg | INTRAMUSCULAR | Status: DC | PRN

## 2016-03-31 MED ORDER — FLEET ENEMA 7-19 GM/118ML RE ENEM: 1.0000 | ENEMA | RECTAL | Status: DC | PRN

## 2016-03-31 MED ORDER — METHYLERGONOVINE MALEATE 0.2 MG/ML IJ SOLN
0.2000 mg | Freq: Once | INTRAMUSCULAR | Status: AC
  Administered 2016-03-31: 0.2 mg via INTRAMUSCULAR

## 2016-03-31 MED ORDER — MEASLES, MUMPS & RUBELLA VAC ~~LOC~~ INJ
0.5000 mL | INJECTION | Freq: Once | SUBCUTANEOUS | Status: DC
  Filled 2016-03-31: qty 0.5

## 2016-03-31 MED ORDER — OXYTOCIN 40 UNITS IN LACTATED RINGERS INFUSION - SIMPLE MED
2.5000 [IU]/h | INTRAVENOUS | Status: DC
  Administered 2016-03-31: 2.5 [IU]/h via INTRAVENOUS
  Filled 2016-03-31: qty 1000

## 2016-03-31 MED ORDER — LACTATED RINGERS IV SOLN: 500.0000 mL | INTRAVENOUS | Status: DC | PRN

## 2016-03-31 NOTE — Lactation Note (Signed)
This note was copied from a baby's chart. Lactation Consultation Note  Patient Name: Danielle [name] GettingKaitlyn Kistler WUJWJ'XToday's Date: 03/31/2016 Reason for consult: Initial assessment Baby at 1 hr of life. Mom was requesting lactation. TMSF, "thick yellow fluid" was suction out of baby. Upon entry baby was sts with mom and cueing. When baby was placed in cradle position and the breast was compressed baby latched comfortably. Mom has soft breast with a wide short nipple. Discussed baby behavior, feeding frequency, baby belly size, voids, wt loss, breast changes, and nipple care. Mom stated she can manually express. Given lactation handouts. Aware of OP services and support group. Mom has UMR and requested Medela Freestyle. The pump was given and the completed [facility].      Maternal Data Has patient been taught Hand Expression?: Yes Does the patient have breastfeeding experience prior to this delivery?: No  Feeding Feeding Type: Breast Fed Length of feed: 15 min  LATCH Score/Interventions Latch: Repeated attempts needed to sustain latch, nipple held in mouth throughout feeding, stimulation needed to elicit sucking reflex. Intervention(s): Adjust position;Assist with latch  Audible Swallowing: A few with stimulation Intervention(s): Skin to skin;Hand expression  Type of Nipple: Everted at rest and after stimulation  Comfort (Breast/Nipple): Soft / non-tender     Hold (Positioning): Assistance needed to correctly position infant at breast and maintain latch. Intervention(s): Position options  LATCH Score: 7  Lactation Tools Discussed/Used     Consult Status Consult Status: Follow-up Date: 04/01/16 Follow-up type: In-patient    Rulon Eisenmengerlizabeth E Zamier Eggebrecht 03/31/2016, 8:57 PM

## 2016-03-31 NOTE — Anesthesia Preprocedure Evaluation (Signed)
Anesthesia Evaluation  Patient identified by MRN, date of birth, ID band Patient awake    Reviewed: Allergy & Precautions, Patient's Chart, lab work & pertinent test results  Airway Mallampati: I  TM Distance: >3 FB Neck ROM: Full    Dental  (+) Teeth Intact, Dental Advisory Given   Pulmonary neg pulmonary ROS,    breath sounds clear to auscultation       Cardiovascular negative cardio ROS   Rhythm:Regular Rate:Normal     Neuro/Psych negative neurological ROS  negative psych ROS   GI/Hepatic negative GI ROS, Neg liver ROS,   Endo/Other  negative endocrine ROS  Renal/GU negative Renal ROS  negative genitourinary   Musculoskeletal negative musculoskeletal ROS (+)   Abdominal   Peds negative pediatric ROS (+)  Hematology negative hematology ROS (+)   Anesthesia Other Findings   Reproductive/Obstetrics (+) Pregnancy                             Lab Results  Component Value Date   WBC 12.1 (H) 03/31/2016   HGB 13.7 03/31/2016   HCT 40.2 03/31/2016   MCV 88.7 03/31/2016   PLT 157 03/31/2016     Anesthesia Physical Anesthesia Plan  ASA: II  Anesthesia Plan: Epidural   Post-op Pain Management:    Induction:   Airway Management Planned:   Additional Equipment:   Intra-op Plan:   Post-operative Plan:   Informed Consent: I have reviewed the patients History and Physical, chart, labs and discussed the procedure including the risks, benefits and alternatives for the proposed anesthesia with the patient or authorized representative who has indicated his/her understanding and acceptance.     Plan Discussed with:   Anesthesia Plan Comments:         Anesthesia Quick Evaluation

## 2016-03-31 NOTE — Progress Notes (Signed)
SVD of vigorous female infant w/ apgars of 8,9.  Placenta delivered spontaneous w/ 3VC.   1st degree and bilateral periurethral lac repaired w/ 3-0 vicryl rapide. 2 episodes of atony during my vaginal repair - treated with IM methergine and PR misoprostol  Fundus firm.  EBL 450cc .

## 2016-03-31 NOTE — MAU Note (Signed)
Pt c/o contractions that started this morning at 0300. Pt states contractions have been getting stronger and closer together. Pt denies leaking of fluid. Pt reports some bloody show. Pt states baby is moving normally.

## 2016-03-31 NOTE — Progress Notes (Signed)
Pt OOB to bathroom steady gait noted. Pt sitting down to void. Able to void 250 with large clot passed. Clot weighed 140 gms.  Pt became lightheaded with ringing in her ears. Additional help called to assist and ammonia capsule given under nose. Pt assisted back to bed and given apple juice and ice pop. Pt feeling much better after being in bed for a few minutes.

## 2016-03-31 NOTE — Progress Notes (Signed)
Pt comfortable w/ epidural  FHT cat 1 Toco Q3-5 Cvx 5/90/-1 AROM - clear  A/P:  Labor Exp mngt

## 2016-03-31 NOTE — H&P (Signed)
Danielle [name] is a 21 y.o. female presenting for SOL.  Pregnancy uncomplicated.  OB History    Gravida Para Term Preterm AB Living   1             SAB TAB Ectopic Multiple Live Births                 Past Medical History:  Diagnosis Date  . Medical history non-contributory    Past Surgical History:  Procedure Laterality Date  . NO PAST SURGERIES     Family History: family history includes Skin cancer in her mother. Social History:  reports that she has never smoked. She has never used smokeless tobacco. She reports that she does not drink alcohol or use drugs.     Maternal Diabetes: No Genetic Screening: Normal Maternal Ultrasounds/Referrals: Normal Fetal Ultrasounds or other Referrals:  None Maternal Substance Abuse:  No Significant Maternal Medications:  None Significant Maternal Lab Results:  None Other Comments:  None  ROS History Dilation: 4 Effacement (%): 90 Station: -1 Exam by:: Danielle [name],Danielle [name] Blood pressure 112/78, pulse 87, temperature 97.5 F (36.4 C), temperature source Oral, resp. rate 20, height 5\' 5"  (1.651 m), weight 160 lb 6.4 oz (72.8 kg), last menstrual period 05/12/2015, SpO2 99 %. Exam Physical Exam  Gen - uncomfortable w/ ctx Abd - gravid, NT Ext - NT Cvx 4/90/-1 Prenatal labs: ABO, Rh: O/Positive/-- (08/09 0000) Antibody: Negative (08/09 0000) Rubella: Immune (08/09 0000) RPR: Nonreactive (08/09 0000)  HBsAg: Negative (08/09 0000)  HIV: Non-reactive (08/09 0000)  GBS: Negative (03/14 0000)   Assessment/Plan: Admit Epidural prn    Danielle [name] 03/31/2016, 3:10 PM

## 2016-03-31 NOTE — Anesthesia Procedure Notes (Signed)
Epidural Patient location during procedure: OB Start time: 03/31/2016 3:36 PM End time: 03/31/2016 3:44 PM  Staffing Anesthesiologist: Shona SimpsonHOLLIS, Sergio Zawislak D Performed: anesthesiologist   Preanesthetic Checklist Completed: patient identified, site marked, surgical consent, pre-op evaluation, timeout performed, IV checked, risks and benefits discussed and monitors and equipment checked  Epidural Patient position: sitting Prep: ChloraPrep Patient monitoring: heart rate, continuous pulse ox and blood pressure Approach: midline Location: L3-L4 Injection technique: LOR saline  Needle:  Needle type: Tuohy  Needle gauge: 17 G Needle length: 9 cm Catheter type: closed end flexible Catheter size: 20 Guage Test dose: negative and 1.5% lidocaine  Assessment Events: blood not aspirated, injection not painful, no injection resistance and no paresthesia  Additional Notes LOR @ 4.5  Patient identified. Risks/Benefits/Options discussed with patient including but not limited to bleeding, infection, nerve damage, paralysis, failed block, incomplete pain control, headache, blood pressure changes, nausea, vomiting, reactions to medications, itching and postpartum back pain. Confirmed with bedside nurse the patient's most recent platelet count. Confirmed with patient that they are not currently taking any anticoagulation, have any bleeding history or any family history of bleeding disorders. Patient expressed understanding and wished to proceed. All questions were answered. Sterile technique was used throughout the entire procedure. Please see nursing notes for vital signs. Test dose was given through epidural catheter and negative prior to continuing to dose epidural or start infusion. Warning signs of high block given to the patient including shortness of breath, tingling/numbness in hands, complete motor block, or any concerning symptoms with instructions to call for help. Patient was given instructions on  fall risk and not to get out of bed. All questions and concerns addressed with instructions to call with any issues or inadequate analgesia.    Reason for block:procedure for pain

## 2016-03-31 NOTE — Progress Notes (Signed)
Pt comfortable w/ epidural  FHT cat 1 Toco Q5-7 Cvx 9/C/+1  A/P:  Exp mngt

## 2016-04-01 LAB — CBC
HCT: 32.9 % — ABNORMAL LOW (ref 36.0–46.0)
Hemoglobin: 11.5 g/dL — ABNORMAL LOW (ref 12.0–15.0)
MCH: 30.7 pg (ref 26.0–34.0)
MCHC: 35 g/dL (ref 30.0–36.0)
MCV: 87.7 fL (ref 78.0–100.0)
Platelets: 141 10*3/uL — ABNORMAL LOW (ref 150–400)
RBC: 3.75 MIL/uL — ABNORMAL LOW (ref 3.87–5.11)
RDW: 14 % (ref 11.5–15.5)
WBC: 16.3 10*3/uL — ABNORMAL HIGH (ref 4.0–10.5)

## 2016-04-01 LAB — RPR: RPR Ser Ql: NONREACTIVE

## 2016-04-01 NOTE — Progress Notes (Signed)
Post Partum Day 1 Subjective: no complaints, voiding, tolerating PO and conitnues with epidural catheter  Objective: Blood pressure 108/68, pulse 77, temperature 97.7 F (36.5 C), temperature source Oral, resp. rate 18, height 5\' 5"  (1.651 m), weight 160 lb 6.4 oz (72.8 kg), last menstrual period 05/12/2015, SpO2 99 %, unknown if currently breastfeeding.  Physical Exam:  General: alert and cooperative Lochia: appropriate Uterine Fundus: firm Incision: healing well DVT Evaluation: No evidence of DVT seen on physical exam. Negative Homan's sign. No cords or calf tenderness. No significant calf/ankle edema.   Recent Labs  03/31/16 1450 04/01/16 0559  HGB 13.7 11.5*  HCT 40.2 32.9*    Assessment/Plan: Plan for discharge tomorrow and Breastfeeding DC IV and epidural catheter  LOS: 1 day   Kieli Golladay G 04/01/2016, 8:11 AM

## 2016-04-01 NOTE — Lactation Note (Signed)
This note was copied from a baby's chart. Lactation Consultation Note  Patient Name: Danielle [name]: 04/01/2016 Reason for consult: Follow-up assessment   Follow up with mom of 17 hour old infant. Infant with 5 BF, 1 attempt, 3 voids and 1 stool since birth. Infant asleep in GF arms.   Mom reports infant is feeding well. Mom reports she has experienced some pain with initial latch that improves as feeding progresses. Mom reports she is feeling a little fuller today and colostrum is easier to express. Mom denied needing assistance at this time.   Mom is asking when she needs to start pumping. Discussed that if BF is going well that she can wait for a few weeks to begin pumping. Mom voiced understanding. Informed her that we would let her know if she needs to begin pumping.    Maternal Data Formula Feeding for Exclusion: No Has patient been taught Hand Expression?: Yes Does the patient have breastfeeding experience prior to this delivery?: No  Feeding Feeding Type: Breast Fed Length of feed: 10 min  LATCH Score/Interventions Latch: Grasps breast easily, tongue down, lips flanged, rhythmical sucking.  Audible Swallowing: A few with stimulation  Type of Nipple: Everted at rest and after stimulation  Comfort (Breast/Nipple): Soft / non-tender     Hold (Positioning): No assistance needed to correctly position infant at breast.  LATCH Score: 9  Lactation Tools Discussed/Used WIC Program: No   Consult Status Consult Status: Follow-up [name]: 04/02/16 Follow-up type: In-patient    Silas FloodSharon S Payslie Mccaig 04/01/2016, 1:26 PM

## 2016-04-01 NOTE — Lactation Note (Addendum)
This note was copied from a baby's chart. Lactation Consultation Note  Patient Name: Danielle [name] VWUJW'JToday's Date: 04/01/2016 Reason for consult: Follow-up assessment  Consult at 21 hours of age.  Baby has had 8 feedings with 3 voids and 1 stool and LATCH scores of "7-9."  Mom denies pain with latch.  Mom has freestyle pump and is aware she may not need it in the hospital as mom is able to hand express.   Mom reports having questions about latching.  Baby is asleep with a recent feeding, so LC discussed cross cradle hold and football hold as a good way to teach baby and for mom to help baby latch.   Mom reports rolling lower lip to widen gape and baby is doing well. LC reviewed early newborn behavior.   LC discussed feeding frequency with early cues on demand.  LC discussed cluster feedings as well.   LC encouraged mom to allow baby STS for feedings and wait for wide open mouth.   Mom to call for assist as needed.     Maternal Data Has patient been taught Hand Expression?: Yes  Feeding Feeding Type: Breast Fed  LATCH Score/Interventions                      Lactation Tools Discussed/Used     Consult Status Consult Status: Follow-up Date: 04/02/16 Follow-up type: In-patient    Tomer Chalmers, Arvella MerlesJana Lynn 04/01/2016, 6:11 PM

## 2016-04-01 NOTE — Anesthesia Postprocedure Evaluation (Signed)
Anesthesia Post Note  Patient: Tonnya Minkin  Procedure(s) Performed: * No procedures listed *  Patient location during evaluation: Mother Baby Anesthesia Type: Epidural Level of consciousness: awake and alert Pain management: pain level controlled Vital Signs Assessment: post-procedure vital signs reviewed and stable Respiratory status: spontaneous breathing Cardiovascular status: blood pressure returned to baseline and stable Postop Assessment: no headache, no backache, epidural receding and adequate PO intake Anesthetic complications: no        Last Vitals:  Vitals:   04/01/16 0407 04/01/16 0605  BP: (!) 98/54 108/68  Pulse: 79 77  Resp: 18 18  Temp: 36.8 C 36.5 C    Last Pain:  Vitals:   04/01/16 1140  TempSrc:   PainSc: 2    Pain Goal: Patients Stated Pain Goal: 0 (03/31/16 1426)               Kalief Kattner

## 2016-04-02 NOTE — Discharge Summary (Signed)
Obstetric Discharge Summary Reason for Admission: onset of labor Prenatal Procedures: none Intrapartum Procedures: spontaneous vaginal delivery Postpartum Procedures: none Complications-Operative and Postpartum: none Hemoglobin  Date Value Ref Range Status  04/01/2016 11.5 (L) 12.0 - 15.0 g/dL Final   HCT  Date Value Ref Range Status  04/01/2016 32.9 (L) 36.0 - 46.0 % Final    Physical Exam:  General: alert Lochia: appropriate Uterine Fundus: firm Incision: healing well DVT Evaluation: No evidence of DVT seen on physical exam.  Discharge Diagnoses: Term Pregnancy-delivered  Discharge Information: Date: 04/02/2016 Activitypelvic rest Diet: routine Medications: PNV and Ibuprofen Condition: stable Instructions: refer to practice specific booklet Discharge to: home Follow-up Information    [facility] Follow up in 6 week(s).   Contact information: [address] [place] [id number] [phone]           Newborn Data: Live born female  Birth Weight: 7 lb 10.6 oz (3475 g) APGAR: 8, 9  Home with mother.  Meriel PicaHOLLAND,Dare Sanger M 04/02/2016, 7:50 AM

## 2016-04-02 NOTE — Lactation Note (Addendum)
This note was copied from a baby's chart. Lactation Consultation Note  Patient Name: Danielle [name] GettingKaitlyn [name] ZOXWR'UToday's Date: 04/02/2016 Reason for consult: Follow-up assessment   With this mom and term baby, now 2339 hours old. NNP Cliffton Astersdonna Brandon, PA -C........, from [facility], was in the room, and she agrees with my earlier assessment of baby's lip and tongue. She recommended Dr, Kipp BroodHinsaw, peds dentist,  In [place], as a good MD to see for evaluation.  The baby is not being discharged, due tio increased bili and not stooling WNL. DEP to be set up for mom to pump, in initiation setting, every 3 hours, and for baby to be supplemented at least every 3 hours, with EBM plus alimentum, up to 20 ml's every 3 hours. Mom fine with this, as well as using a bottle to supplemnt. Dad and  MGM in room, and very agreeable to plan also.   Maternal Data    Feeding Feeding Type: Breast Fed Length of feed: 30 min (with 24 nipple shield)  LATCH Score/Interventions Latch: Repeated attempts needed to sustain latch, nipple held in mouth throughout feeding, stimulation needed to elicit sucking reflex. (baby did well opnce she got used to feel of shield) Intervention(s): Adjust position;Assist with latch;Breast compression  Audible Swallowing: A few with stimulation (easily expressed colostrum) Intervention(s): Hand expression;Skin to skin  Type of Nipple: Everted at rest and after stimulation  Comfort (Breast/Nipple): Soft / non-tender     Hold (Positioning): Assistance needed to correctly position infant at breast and maintain latch. Intervention(s): Breastfeeding basics reviewed;Support Pillows;Position options;Skin to skin  LATCH Score: 7  Lactation Tools Discussed/Used Tools: Nipple Shields Nipple shield size: 20;24;Other (comment) (24 good fit) Pump Review: Setup, frequency, and cleaning;Other (comment) (hand expression reviewed )   Consult Status Consult Status: Follow-up Date: 04/03/16 Follow-up type:  In-patient    Alfred LevinsLee, Delano Frate Anne 04/02/2016, 10:28 AM

## 2016-04-02 NOTE — Lactation Note (Signed)
This note was copied from a baby's chart. Lactation Consultation Note  Patient Name: Danielle [name]: 04/02/2016 Reason for consult: Follow-up assessment   With this first time om and term baby, now 2338 hours old. Mom reports cluster feeding during the night, bat baby only feeding for about 5 minutes, and then asleep. Baby was at 5.2% weight loss at at about 29 hours of life. Baby with normal amounts of wet and dirty diapers.  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 It is difficult for baby to flange her upper lip. I showed these finding to the parents and maternal GM, and gave them oral restriction resources, to educate them on this. I fitted mom with a 24 nipple shield, and then helped mom latch Danielle [name] in cross cradle hold. I hand expressed colostrum into the shield, and showed parents how to do this also. Danielle [name] latched and stayed latched, with intermittent strong, deep suckles, for at least 30 minutes. Dad reports Danielle [name] usyally pulls off the breast after the first minute, and parents feel this is the best Danielle [name] has done so far, at breastfeeding. I explained that the shield makes it easier for her to hold on, and latch deeper. Good breast movement noted.  I also showed mom how to use a manual hand pump, and told her if the baby needs to stay for high bili(she is jaundiced, and bili pending), I will set up a DEP for mom. For now, I gave mom colostrum catcher, and showed her how to hand express into this container, and gave her curved tip syring, to use prior to lataach, to fill shield with colostrum.  Mom advised tp pump at home with dep, at least 4 times a day, for 15 minutes, to protetc her supply and provide eBm for Danielle [name] as supplement to BF. I will show parents later how to finger feed with curved tip  syringe.  Parents very receptive to teaching, and I will go back when pediatrician comes to see baby today.    Maternal Data    Feeding Feeding Type: Breast Fed Length of feed: 30 min (with 24 nipple shield)  LATCH Score/Interventions Latch: Repeated attempts needed to sustain latch, nipple held in mouth throughout feeding, stimulation needed to elicit sucking reflex. (baby did well opnce she got used to feel of shield) Intervention(s): Adjust position;Assist with latch;Breast compression  Audible Swallowing: A few with stimulation (easily expressed colostrum) Intervention(s): Hand expression;Skin to skin  Type of Nipple: Everted at rest and after stimulation  Comfort (Breast/Nipple): Soft / non-tender     Hold (Positioning): Assistance needed to correctly position infant at breast and maintain latch. Intervention(s): Breastfeeding basics reviewed;Support Pillows;Position options;Skin to skin  LATCH Score: 7  Lactation Tools Discussed/Used Tools: Nipple Shields Nipple shield size: 20;24;Other (comment) (24 good fit) Pump Review: Setup, frequency, and cleaning;Other (comment) (hand expression reviewed )   Consult Status Consult Status: Follow-up [name]: 04/02/16 Follow-up type: In-patient    Danielle [name], Danielle [name] Danielle [name] 04/02/2016, 9:34 AM

## 2016-04-03 ENCOUNTER — Ambulatory Visit: Payer: Self-pay

## 2016-04-03 NOTE — Lactation Note (Signed)
This note was copied from a baby's chart. Lactation Consultation Note  Patient Name: [name] ZOXWR'UToday's Date: 04/03/2016 Reason for consult: Follow-up assessment    With this mom of a term baby, now 2561 hours old, and being mostly formula fed at this time. Mom is hand expressing and once through the night, used nipple shield and breast fed the baby. Bilirubin drawn at 0400 was increased to 12.5. Pediatrician not in yet to see baby today.  I set up DEP for mom, showed her how to set initiation setting, and advised her to pump at least every 3 hours, followed by hand expression. Mom knows to feed EBm to baby, prior to formula.  O/P lactation appointment will be made, prior to mom and baby's discharge to home.   Maternal Data    Feeding Feeding Type: Formula Nipple Type: Slow - flow  LATCH Score/Interventions                      Lactation Tools Discussed/Used Pump Review: Setup, frequency, and cleaning;Milk Storage Initiated by:: c Nedra HaiLee RN IBCLC Date initiated:: 04/03/16   Consult Status Consult Status: Follow-up Date: 04/03/16 Follow-up type: In-patient    Alfred LevinsLee, Abel Ra Anne 04/03/2016, 8:40 AM

## 2016-04-05 ENCOUNTER — Inpatient Hospital Stay (HOSPITAL_COMMUNITY): Admission: RE | Admit: 2016-04-05 | Payer: 59 | Source: Ambulatory Visit

## 2016-04-29 ENCOUNTER — Ambulatory Visit: Payer: Self-pay

## 2016-04-29 ENCOUNTER — Inpatient Hospital Stay (HOSPITAL_COMMUNITY): Admission: RE | Admit: 2016-04-29 | Payer: 59 | Source: Ambulatory Visit

## 2016-04-29 NOTE — Lactation Note (Signed)
This note was copied from a baby's chart. Lactation Consult  Mother's reason for visit:  Dr Danielle [name] clipped infants tongue and lip on has on April 17.  Visit Type: feeding assessment Appointment Notes:Mother wants to breastfeed exclusively, but infant has never breastfed. She had difficulty with latch in the hospital, but  never sustained a latch. 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 Consult:  Initial Lactation Consultant:  Danielle [name]  ________________________________________________________________________    ________________________________________________________________________  Mother's Name: Danielle [name] Type of delivery:  Vaginal, Spontaneous Delivery Breastfeeding Experience:  none Maternal Medical Conditions:  none Maternal Medications:  Prenatal vits  ________________________________________________________________________  Breastfeeding History (Post Discharge)  Frequency of breastfeeding: none, Mother has not been breastfeeding since she left the hospital Duration of feeding:   Pumping  Type of pump:  Free style Medela Frequency:  4 times daily Volume: 6-7 ounces  Infant Intake and Output Assessment - Voids: 8-10  in 24 hrs.  Color:  Clear yellow Stools:1-2 in 24 hrs.  Color:  Yellow  ________________________________________________________________________  Maternal Breast Assessment  Breast:  Full Nipple:  Erect Pain level:  0 Pain interventions:  Bra  _______________________________________________________________________ Feeding Assessment/Evaluation; infant fed very well using a #24 nipple shield. Observed lips widely gaped and jaw relaxed.   Initial feeding assessment: Mother was fit with a #24 nipple shield and infant placed in cross cradle hold. Infant latched on with wide gape. A slight tug on the lower jaw for wider gape and rolled top lip upward. Infant sustained latch for 15-20 mins. Observed audible  swallows. Infant transferred 36 ml  Infant's oral assessment:  Variance, observed that tongue is healing.  Positioning:  Cross cradle Left breast  LATCH documentation:  Latch:  2 = Grasps breast easily, tongue down, lips flanged, rhythmical sucking.  Audible swallowing:  2 = Spontaneous and intermittent  Type of nipple:  2 = Everted at rest and after stimulation  Comfort (Breast/Nipple):  1 = Filling, red/small blisters or bruises, mild/mod discomfort  Hold (Positioning):  2 = No assistance needed to correctly position infant at breast  LATCH score:  9  Attached assessment:  Deep  Lips flanged:  Yes.    Lips untucked:  Yes.    Suck assessment:  Displays both  Tools:  Nipple shield 24 mm Instructed on use and cleaning of tool:  Yes.    Pre-feed weight:  9-1.9, 4134 Post-feed weight:9-3.1, 4170 Amount transferred:  36 ml   Additional Feeding Assessment - infant placed in football hold and latched using the shield. Infant sustained latch for 20 mins. Observed good milk transfer with lips widely gaped. Infant transferred 20 ml   Infant's oral assessment:  Variance  Positioning:  Football Right breast  LATCH documentation:  Latch:  2 = Grasps breast easily, tongue down, lips flanged, rhythmical sucking.  Audible swallowing:  2 = Spontaneous and intermittent  Type of nipple:  2 = Everted at rest and after stimulation  Comfort (Breast/Nipple):  1 = Filling, red/small blisters or bruises, mild/mod discomfort  Hold (Positioning):  2 = No assistance needed to correctly position infant at breast  LATCH score:  9  Attached assessment:  Deep  Lips flanged:  Yes.    Lips untucked:  Yes.    Suck assessment:  Displays both  Tools:  Nipple shield 24 mm Instructed on use and cleaning of tool:  Yes.    Pre-feed weight:4136   Post-feed weight:  4156 Amount transferred: 20 ml Mother offered  ebm in a bottle and infant refused   Total amount transferred:  56 ml No supplement  given  Advised mother to cue base feed infant.  Feed early instead of waiting for infant to wake Feed infant at least 8-12 times in 24 hours Use the #24 nipple shield Supplement infant at least 2-3 ounces after feeding if needed Do frequent skin to skin Continue to post pump breast 4-5 times daily Pump for 20-30 mins  suggested power pumping once every few days after going back to work Advised mother to nap daily Mother to follow up with Peds on May 7 Suggested to follow up with LC prn

## 2016-05-09 DIAGNOSIS — Z1389 Encounter for screening for other disorder: Secondary | ICD-10-CM | POA: Diagnosis not present

## 2016-05-27 DIAGNOSIS — Z30430 Encounter for insertion of intrauterine contraceptive device: Secondary | ICD-10-CM | POA: Diagnosis not present

## 2016-05-27 DIAGNOSIS — Z3202 Encounter for pregnancy test, result negative: Secondary | ICD-10-CM | POA: Diagnosis not present

## 2016-06-16 DIAGNOSIS — H5213 Myopia, bilateral: Secondary | ICD-10-CM | POA: Diagnosis not present

## 2016-06-21 ENCOUNTER — Telehealth: Payer: Self-pay | Admitting: Family Medicine

## 2016-06-21 NOTE — Telephone Encounter (Signed)
Pt would like to pick up her immunization record for school tomorrow.

## 2016-06-21 NOTE — Telephone Encounter (Signed)
I could not find any record of this pt in the [facility].  Message [facility] to [facility].

## 2016-06-22 NOTE — Telephone Encounter (Signed)
Pt changed her name 2 months ago. Zenon MayoSheena was able to locate with her previous name.  Left message for pt to call back. Pt overdue for tetanus and menig

## 2016-07-07 DIAGNOSIS — Z30431 Encounter for routine checking of intrauterine contraceptive device: Secondary | ICD-10-CM | POA: Diagnosis not present

## 2016-09-29 ENCOUNTER — Encounter: Payer: Self-pay | Admitting: Family Medicine

## 2016-11-20 ENCOUNTER — Telehealth: Payer: 59 | Admitting: Family

## 2016-11-20 DIAGNOSIS — H109 Unspecified conjunctivitis: Secondary | ICD-10-CM

## 2016-11-20 MED ORDER — POLYMYXIN B-TRIMETHOPRIM 10000-0.1 UNIT/ML-% OP SOLN: 1.0000 [drp] | OPHTHALMIC | 0 refills | Status: DC

## 2016-11-20 NOTE — Progress Notes (Signed)

## 2016-12-27 DIAGNOSIS — H18823 Corneal disorder due to contact lens, bilateral: Secondary | ICD-10-CM | POA: Diagnosis not present

## 2017-01-19 ENCOUNTER — Encounter: Payer: Self-pay | Admitting: Family Medicine

## 2017-02-27 ENCOUNTER — Telehealth: Payer: 59 | Admitting: Family

## 2017-02-27 DIAGNOSIS — J111 Influenza due to unidentified influenza virus with other respiratory manifestations: Secondary | ICD-10-CM

## 2017-02-27 MED ORDER — OSELTAMIVIR PHOSPHATE 75 MG PO CAPS: 75.0000 mg | ORAL_CAPSULE | Freq: Two times a day (BID) | ORAL | 0 refills | Status: DC

## 2017-02-27 NOTE — Progress Notes (Signed)
Thank you for the details you included in the comment boxes. Those details are very helpful in determining the best course of treatment for you and help us to provide the best care. Given our phone call, and your high exposure to patients with the flu, I will give tamiflu below.   E visit for Flu like symptoms   We are sorry that you are not feeling well.  Here is how we plan to help! Based on what you have shared with me it looks like you may have possible exposure to a virus that causes influenza.  Influenza or "the flu" is   an infection caused by a respiratory virus. The flu virus is highly contagious and persons who did not receive their yearly flu vaccination may "catch" the flu from close contact.  We have anti-viral medications to treat the viruses that cause this infection. They are not a "cure" and only shorten the course of the infection. These prescriptions are most effective when they are given within the first 2 days of "flu" symptoms. Antiviral medication are indicated if you have a high risk of complications from the flu. You should  also consider an antiviral medication if you are in close contact with someone who is at risk. These medications can help patients avoid complications from the flu  but have side effects that you should know. Possible side effects from Tamiflu or oseltamivir include nausea, vomiting, diarrhea, dizziness, headaches, eye redness, sleep problems or other respiratory symptoms. You should not take Tamiflu if you have an allergy to oseltamivir or any to the ingredients in Tamiflu.  Based upon your symptoms and potential risk factors I have prescribed Oseltamivir (Tamiflu).  It has been sent to your designated pharmacy.  You will take one 75 mg capsule orally twice a day for the next 5 days.  ANYONE WHO HAS FLU SYMPTOMS SHOULD: . Stay home. The flu is highly contagious and going out or to work exposes others! . Be sure to drink plenty of fluids. Water is fine as  well as fruit juices, sodas and electrolyte beverages. You may want to stay away from caffeine or alcohol. If you are nauseated, try taking small sips of liquids. How do you know if you are getting enough fluid? Your urine should be a pale yellow or almost colorless. . Get rest. . Taking a steamy shower or using a humidifier may help nasal congestion and ease sore throat pain. Using a saline nasal spray works much the same way. . Cough drops, hard candies and sore throat lozenges may ease your cough. . Line up a caregiver. Have someone check on you regularly.   GET HELP RIGHT AWAY IF: . You cannot keep down liquids or your medications. . You become short of breath . Your fell like you are going to pass out or loose consciousness. . Your symptoms persist after you have completed your treatment plan MAKE SURE YOU   Understand these instructions.  Will watch your condition.  Will get help right away if you are not doing well or get worse.  Your e-visit answers were reviewed by a board certified advanced clinical practitioner to complete your personal care plan.  Depending on the condition, your plan could have included both over the counter or prescription medications.  If there is a problem please reply  once you have received a response from your provider.  Your safety is important to us.  If you have drug allergies check your prescription carefully.  You can use MyChart to ask questions about today's visit, request a non-urgent call back, or ask for a work or school excuse for 24 hours related to this e-Visit. If it has been greater than 24 hours you will need to follow up with your provider, or enter a new e-Visit to address those concerns.  You will get an e-mail in the next two days asking about your experience.  I hope that your e-visit has been valuable and will speed your recovery. Thank you for using e-visits.

## 2017-04-24 DIAGNOSIS — H5213 Myopia, bilateral: Secondary | ICD-10-CM | POA: Diagnosis not present

## 2017-05-16 DIAGNOSIS — Z01419 Encounter for gynecological examination (general) (routine) without abnormal findings: Secondary | ICD-10-CM | POA: Diagnosis not present

## 2017-05-16 DIAGNOSIS — Z6822 Body mass index (BMI) 22.0-22.9, adult: Secondary | ICD-10-CM | POA: Diagnosis not present

## 2017-05-16 DIAGNOSIS — N760 Acute vaginitis: Secondary | ICD-10-CM | POA: Diagnosis not present

## 2017-07-27 ENCOUNTER — Telehealth: Payer: 59 | Admitting: Family

## 2017-07-27 DIAGNOSIS — R112 Nausea with vomiting, unspecified: Secondary | ICD-10-CM

## 2017-07-27 MED ORDER — ONDANSETRON 4 MG PO TBDP
4.0000 mg | ORAL_TABLET | Freq: Three times a day (TID) | ORAL | 0 refills | Status: DC | PRN
Start: 2017-07-27 — End: 2018-01-04

## 2017-07-27 NOTE — Progress Notes (Signed)
Thank you for the details you included in the comment boxes. Those details are very helpful in determining the best course of treatment for you and help us to provide the best care.  We are sorry that you are not feeling well.  Here is how we plan to help!  Based on what you have shared with me it looks like you have Acute Infectious Diarrhea.  Most cases of acute diarrhea are due to infections with virus and bacteria and are self-limited conditions lasting less than 14 days.  For your symptoms you may take Imodium 2 mg tablets that are over the counter at your local pharmacy. Take two tablet now and then one after each loose stool up to 6 a day.  Antibiotics are not needed for most people with diarrhea.  Optional: Zofran 4 mg 1 tablet every 8 hours as needed for nausea and vomiting   HOME CARE  We recommend changing your diet to help with your symptoms for the next few days.  Drink plenty of fluids that contain water salt and sugar. Sports drinks such as Gatorade may help.   You may try broths, soups, bananas, applesauce, soft breads, mashed potatoes or crackers.   You are considered infectious for as long as the diarrhea continues. Hand washing or use of alcohol based hand sanitizers is recommend.  It is best to stay out of work or school until your symptoms stop.   GET HELP RIGHT AWAY  If you have dark yellow colored urine or do not pass urine frequently you should drink more fluids.    If your symptoms worsen   If you feel like you are going to pass out (faint)  You have a new problem  MAKE SURE YOU   Understand these instructions.  Will watch your condition.  Will get help right away if you are not doing well or get worse.  Your e-visit answers were reviewed by a board certified advanced clinical practitioner to complete your personal care plan.  Depending on the condition, your plan could have included both over the counter or prescription medications.  If there is  a problem please reply  once you have received a response from your provider.  Your safety is important to us.  If you have drug allergies check your prescription carefully.    You can use MyChart to ask questions about today's visit, request a non-urgent call back, or ask for a work or school excuse for 24 hours related to this e-Visit. If it has been greater than 24 hours you will need to follow up with your provider, or enter a new e-Visit to address those concerns.   You will get an e-mail in the next two days asking about your experience.  I hope that your e-visit has been valuable and will speed your recovery. Thank you for using e-visits.   

## 2017-08-22 ENCOUNTER — Telehealth: Payer: 59 | Admitting: Physician Assistant

## 2017-08-22 DIAGNOSIS — H1030 Unspecified acute conjunctivitis, unspecified eye: Secondary | ICD-10-CM

## 2017-08-22 MED ORDER — POLYMYXIN B-TRIMETHOPRIM 10000-0.1 UNIT/ML-% OP SOLN: 1.0000 [drp] | Freq: Four times a day (QID) | OPHTHALMIC | 0 refills | Status: DC

## 2017-08-22 NOTE — Progress Notes (Signed)

## 2017-08-23 DIAGNOSIS — H18212 Corneal edema secondary to contact lens, left eye: Secondary | ICD-10-CM | POA: Diagnosis not present

## 2018-01-03 NOTE — Progress Notes (Signed)
Subjective:    Patient ID: Danielle GettingKaitlyn [name], female    DOB: 1995-08-09, 22 y.o.   MRN: [id number]  HPI:  Danielle [name] is here to establish as a new pt.  She is a pleasant 22 year old female. PMH: Chronic hx of anxiety with recent increase in sx's the last 6 months r/t two issues: 171) 5618 month old daughter "Lajoyce Cornersvy" has enlarged tonsils/adenoids and it is causing snoring and sleep apnea.  She will constantly check in on her at night to ensure that she is breathing.  She reports that her daughter will have periods of apnea when napping in car during daily commute from home to daycare. Her daughter has upcoming appt with pediatric ENT to discuss surgical intervention 2) Her step-mother just asked her father for a sudden divorce after 16 years of marriage to her father.   [place]  He [place] alternating [place] in [place]- two weeks in each location then changing He still practices as a veternarian  She denies tobacco/vape/excessive ETOH use She walks >20 K steps/day She follows a heart healthy diet consistently  [facility] and would like low-dose Alprazolam rx for acute anxiety flare-ups  Patient Care Team    Relationship Specialty Notifications Start End  William Hamburgeranford, Katy D, NP PCP - General Family Medicine  01/04/18   Mitchel HonourMorris, Megan, DO Consulting Physician Obstetrics and Gynecology  01/04/18     Patient Active Problem List   Diagnosis Date Noted  . Healthcare maintenance 01/04/2018  . Anxiety 01/04/2018  . Normal labor 03/31/2016  . SVD (spontaneous vaginal delivery) 03/31/2016     Past Medical History:  Diagnosis Date  . Anxiety   . Medical history non-contributory      Past Surgical History:  Procedure Laterality Date  . NO PAST SURGERIES       Family History  Problem Relation Age of Onset  . Skin cancer Mother   . Depression  Father      Social History   Substance and Sexual Activity  Drug Use No     Social History   Substance and Sexual Activity  Alcohol Use Yes  . Alcohol/week: 1.0 standard drinks  . Types: 1 Glasses of wine per week     Social History   Tobacco Use  Smoking Status Never Smoker  Smokeless Tobacco Never Used     Outpatient Encounter Medications as of 01/04/2018  Medication Sig  . levonorgestrel (MIRENA) 20 MCG/24HR IUD 1 each by Intrauterine route once.  . ALPRAZolam (XANAX) 0.25 MG tablet Take 1 tablet (0.25 mg total) by mouth 2 (two) times daily as needed for anxiety.  . [DISCONTINUED] ondansetron (ZOFRAN ODT) 4 MG disintegrating tablet Take 1 tablet (4 mg total) by mouth every 8 (eight) hours as needed for nausea or vomiting.  . [DISCONTINUED] oseltamivir (TAMIFLU) 75 MG capsule Take 1 capsule (75 mg total) by mouth 2 (two) times daily.  . [DISCONTINUED] Prenatal Vit-Fe Fumarate-FA (PRENATAL MULTIVITAMIN) TABS tablet Take 1 tablet by mouth daily at 12 noon.  . [DISCONTINUED] trimethoprim-polymyxin b (POLYTRIM) ophthalmic solution Place 1 drop into both eyes every 6 (six) hours.   No facility-administered encounter medications on file as of 01/04/2018.     Allergies: Patient has no known allergies.  Body mass index is 22.75 kg/m.  Blood pressure (!) 93/57, pulse 76, temperature 98.3 F (  36.8 C), temperature source Oral, height 5\' 5"  (1.651 m), weight 136 lb 11.2 oz (62 kg), SpO2 96 %, unknown if currently breastfeeding.  Review of Systems  Constitutional: Positive for fatigue. Negative for activity change, appetite change, chills, diaphoresis, fever and unexpected weight change.  Eyes: Negative for visual disturbance.  Respiratory: Negative for cough, choking, chest tightness, shortness of breath, wheezing and stridor.   Cardiovascular: Negative for chest pain, palpitations and leg swelling.  Gastrointestinal: Negative for abdominal distention, abdominal pain,  blood in stool, constipation, diarrhea, nausea, rectal pain and vomiting.  Endocrine: Negative for cold intolerance, heat intolerance, polydipsia, polyphagia and polyuria.  Genitourinary: Negative for difficulty urinating and flank pain.  Musculoskeletal: Negative for arthralgias, back pain, gait problem, joint swelling, myalgias, neck pain and neck stiffness.  Skin: Negative for color change, pallor, rash and wound.  Neurological: Negative for dizziness.  Hematological: Does not bruise/bleed easily.  Psychiatric/Behavioral: Positive for sleep disturbance. Negative for agitation, behavioral problems, confusion, decreased concentration, dysphoric mood, hallucinations, self-injury and suicidal ideas. The patient is nervous/anxious. The patient is not hyperactive.        Objective:   Physical Exam Vitals signs and nursing note reviewed.  Constitutional:      General: She is not in acute distress.    Appearance: She is normal weight. She is not ill-appearing, toxic-appearing or diaphoretic.  HENT:     Head: Normocephalic and atraumatic.  Eyes:     Extraocular Movements: Extraocular movements intact.     Conjunctiva/sclera: Conjunctivae normal.     Pupils: Pupils are equal, round, and reactive to light.  Cardiovascular:     Rate and Rhythm: Normal rate.     Pulses: Normal pulses.     Heart sounds: Normal heart sounds. No murmur. No friction rub. No gallop.   Pulmonary:     Effort: Pulmonary effort is normal. No respiratory distress.     Breath sounds: Normal breath sounds. No stridor. No wheezing, rhonchi or rales.  Chest:     Chest wall: No tenderness.  Skin:    Capillary Refill: Capillary refill takes less than 2 seconds.  Neurological:     Mental Status: She is alert and oriented to person, place, and time.  Psychiatric:        Mood and Affect: Mood normal.        Behavior: Behavior normal.        Thought Content: Thought content normal.        Judgment: Judgment normal.            Assessment & Plan:   1. Healthcare maintenance   2. Screening for HIV (human immunodeficiency virus)   3. Anxiety     Healthcare maintenance Continue to drink plenty of water and follow Mediterranean diet. Continue to walk daily. Referral to Behavioral Health placed. Alprazolam 0.25mg  prescription sent in- please use sparingly. Please schedule complete physical in 5 months, fasting lab appt the week prior.  Anxiety [place] Sub[place] reviewed- no aberrancies noted Referral to Behavioral Health placed. Alprazolam 0.25mg  prescription sent in- please use sparingly. Continue regular exercise     FOLLOW-UP:  Return in about 5 months (around 06/05/2018) for CPE, Fasting Labs.

## 2018-01-04 ENCOUNTER — Encounter: Payer: Self-pay | Admitting: Adult Health

## 2018-01-04 ENCOUNTER — Ambulatory Visit (INDEPENDENT_AMBULATORY_CARE_PROVIDER_SITE_OTHER): Payer: 59 | Admitting: Adult Health

## 2018-01-04 VITALS — BP 93/57 | HR 76 | Temp 98.3°F | Ht 65.0 in | Wt 136.7 lb

## 2018-01-04 DIAGNOSIS — F419 Anxiety disorder, unspecified: Secondary | ICD-10-CM | POA: Diagnosis not present

## 2018-01-04 DIAGNOSIS — Z0000 Encounter for general adult medical examination without abnormal findings: Secondary | ICD-10-CM | POA: Diagnosis not present

## 2018-01-04 DIAGNOSIS — Z114 Encounter for screening for human immunodeficiency virus [HIV]: Secondary | ICD-10-CM

## 2018-01-04 MED ORDER — ALPRAZOLAM 0.25 MG PO TABS: 0.2500 mg | ORAL_TABLET | Freq: Two times a day (BID) | ORAL | 0 refills | Status: DC | PRN

## 2018-01-04 MED FILL — ALPRAZolam 0.25 MG TABS: 0.25 | 10 days supply | Qty: 20 | Fill #0

## 2018-01-04 NOTE — Patient Instructions (Addendum)
Mediterranean Diet A Mediterranean diet refers to food and lifestyle choices that are based on the traditions of countries located on the [employer]. This way of eating has been shown to help prevent certain conditions and improve outcomes for people who have chronic diseases, like kidney disease and heart disease. What are tips for following this plan? Lifestyle  Cook and eat meals together with your family, when possible.  Drink enough fluid to keep your urine clear or pale yellow.  Be physically active every day. This includes: ? Aerobic exercise like running or swimming. ? Leisure activities like gardening, walking, or housework.  Get 7-8 hours of sleep each night.  If recommended by your health care provider, drink red wine in moderation. This means 1 glass a day for nonpregnant women and 2 glasses a day for men. A glass of wine equals 5 oz (150 mL). Reading food labels   Check the serving size of packaged foods. For foods such as rice and pasta, the serving size refers to the amount of cooked product, not dry.  Check the total fat in packaged foods. Avoid foods that have saturated fat or trans fats.  Check the ingredients list for added sugars, such as corn syrup. Shopping  At the grocery store, buy most of your food from the areas near the walls of the store. This includes: ? Fresh fruits and vegetables (produce). ? Grains, beans, nuts, and seeds. Some of these may be available in unpackaged forms or large amounts (in bulk). ? Fresh seafood. ? Poultry and eggs. ? Low-fat dairy products.  Buy whole ingredients instead of prepackaged foods.  Buy fresh fruits and vegetables in-season from local farmers markets.  Buy frozen fruits and vegetables in resealable bags.  If you do not have access to quality fresh seafood, buy precooked frozen shrimp or canned fish, such as tuna, salmon, or sardines.  Buy small amounts of raw or cooked vegetables, salads, or olives from  the deli or salad bar at your store.  Stock your pantry so you always have certain foods on hand, such as olive oil, canned tuna, canned tomatoes, rice, pasta, and beans. Cooking  Cook foods with extra-virgin olive oil instead of using butter or other vegetable oils.  Have meat as a side dish, and have vegetables or grains as your main dish. This means having meat in small portions or adding small amounts of meat to foods like pasta or stew.  Use beans or vegetables instead of meat in common dishes like chili or lasagna.  Experiment with different cooking methods. Try roasting or broiling vegetables instead of steaming or sauteing them.  Add frozen vegetables to soups, stews, pasta, or rice.  Add nuts or seeds for added healthy fat at each meal. You can add these to yogurt, salads, or vegetable dishes.  Marinate fish or vegetables using olive oil, lemon juice, garlic, and fresh herbs. Meal planning   Plan to eat 1 vegetarian meal one day each week. Try to work up to 2 vegetarian meals, if possible.  Eat seafood 2 or more times a week.  Have healthy snacks readily available, such as: ? Vegetable sticks with hummus. ? [place] yogurt. ? Fruit and nut trail mix.  Eat balanced meals throughout the week. This includes: ? Fruit: 2-3 servings a day ? Vegetables: 4-5 servings a day ? Low-fat dairy: 2 servings a day ? Fish, poultry, or lean meat: 1 serving a day ? Beans and legumes: 2 or more servings a week ?  Nuts and seeds: 1-2 servings a day ? Whole grains: 6-8 servings a day ? Extra-virgin olive oil: 3-4 servings a day  Limit red meat and sweets to only a few servings a month What are my food choices?  Mediterranean diet ? Recommended ? Grains: Whole-grain pasta. Brown rice. Bulgar wheat. Polenta. Couscous. Whole-wheat bread. Orp[place] Cobbatmeal. Quinoa. ? Vegetables: Artichokes. Beets. Broccoli. Cabbage. Carrots. Eggplant. Green beans. Chard. Kale. Spinach. Onions. Leeks. Peas. Squash.  Tomatoes. Peppers. Radishes. ? Fruits: Apples. Apricots. Avocado. Berries. Bananas. Cherries. Dates. Figs. Grapes. Lemons. Melon. Oranges. Peaches. Plums. Pomegranate. ? Meats and other protein foods: Beans. Almonds. Sunflower seeds. Pine nuts. Peanuts. Cod. Salmon. Scallops. Shrimp. Tuna. Tilapia. Clams. Oysters. Eggs. ? Dairy: Low-fat milk. Cheese. Greek yogurt. ? Beverages: Water. Red wine. Herbal tea. ? Fats and oils: Extra virgin olive oil. Avocado oil. Grape seed oil. ? Sweets and desserts: [place] yogurt with honey. Baked apples. Poached pears. Trail mix. ? Seasoning and other foods: Basil. Cilantro. Coriander. Cumin. Mint. Parsley. Sage. Rosemary. Tarragon. Garlic. Oregano. Thyme. Pepper. Balsalmic vinegar. Tahini. Hummus. Tomato sauce. Olives. Mushrooms. ? Limit these ? Grains: Prepackaged pasta or rice dishes. Prepackaged cereal with added sugar. ? Vegetables: Deep fried potatoes (french fries). ? Fruits: Fruit canned in syrup. ? Meats and other protein foods: Beef. Pork. Lamb. Poultry with skin. Hot dogs. Tomasa BlaseBacon. ? Dairy: Ice cream. Sour cream. Whole milk. ? Beverages: Juice. Sugar-sweetened soft drinks. Beer. Liquor and spirits. ? Fats and oils: Butter. Canola oil. Vegetable oil. Beef fat (tallow). Lard. ? Sweets and desserts: Cookies. Cakes. Pies. Candy. ? Seasoning and other foods: Mayonnaise. Premade sauces and marinades. ? The items listed may not be a complete list. Talk with your dietitian about what dietary choices are right for you. Summary  The Mediterranean diet includes both food and lifestyle choices.  Eat a variety of fresh fruits and vegetables, beans, nuts, seeds, and whole grains.  Limit the amount of red meat and sweets that you eat.  Talk with your health care provider about whether it is safe for you to drink red wine in moderation. This means 1 glass a day for nonpregnant women and 2 glasses a day for men. A glass of wine equals 5 oz (150 mL). This information  is not intended to replace advice given to you by your health care provider. Make sure you discuss any questions you have with your health care provider. Document Released: 08/20/2015 Document Revised: 09/22/2015 Document Reviewed: 08/20/2015 Elsevier Interactive Patient Education  2019 Elsevier Inc.   Living With Anxiety  After being diagnosed with an anxiety disorder, you may be relieved to know why you have felt or behaved a certain way. It is natural to also feel overwhelmed about the treatment ahead and what it will mean for your life. With care and support, you can manage this condition and recover from it. How to cope with anxiety Dealing with stress Stress is your body's reaction to life changes and events, both good and bad. Stress can last just a few hours or it can be ongoing. Stress can play a major role in anxiety, so it is important to learn both how to cope with stress and how to think about it differently. Talk with your health care provider or a counselor to learn more about stress reduction. He or she may suggest some stress reduction techniques, such as: Music therapy. This can include creating or listening to music that you enjoy and that inspires you. Mindfulness-based meditation. This involves being aware  of your normal breaths, rather than trying to control your breathing. It can be done while sitting or walking. Centering prayer. This is a kind of meditation that involves focusing on a word, phrase, or sacred image that is meaningful to you and that brings you peace. Deep breathing. To do this, expand your stomach and inhale slowly through your nose. Hold your breath for 3-5 seconds. Then exhale slowly, allowing your stomach muscles to relax. Self-talk. This is a skill where you identify thought patterns that lead to anxiety reactions and correct those thoughts. Muscle relaxation. This involves tensing muscles then relaxing them. Choose a stress reduction technique that  fits your lifestyle and personality. Stress reduction techniques take time and practice. Set aside 5-15 minutes a day to do them. Therapists can offer training in these techniques. The training may be covered by some insurance plans. Other things you can do to manage stress include: Keeping a stress diary. This can help you learn what triggers your stress and ways to control your response. Thinking about how you respond to certain situations. You may not be able to control everything, but you can control your reaction. Making time for activities that help you relax, and not feeling guilty about spending your time in this way. Therapy combined with coping and stress-reduction skills provides the best chance for successful treatment. Medicines Medicines can help ease symptoms. Medicines for anxiety include: Anti-anxiety drugs. Antidepressants. Beta-blockers. Medicines may be used as the main treatment for anxiety disorder, along with therapy, or if other treatments are not working. Medicines should be prescribed by a health care provider. Relationships Relationships can play a big part in helping you recover. Try to spend more time connecting with trusted friends and family members. Consider going to couples counseling, taking family education classes, or going to family therapy. Therapy can help you and others better understand the condition. How to recognize changes in your condition Everyone has a different response to treatment for anxiety. Recovery from anxiety happens when symptoms decrease and stop interfering with your daily activities at home or work. This may mean that you will start to: Have better concentration and focus. Sleep better. Be less irritable. Have more energy. Have improved memory. It is important to recognize when your condition is getting worse. Contact your health care provider if your symptoms interfere with home or work and you do not feel like your condition is  improving. Where to find help and support: You can get help and support from these sources: Self-help groups. Online and [employer]community organizations. A trusted spiritual leader. Couples counseling. Family education classes. Family therapy. Follow these instructions at home: Eat a healthy diet that includes plenty of vegetables, fruits, whole grains, low-fat dairy products, and lean protein. Do not eat a lot of foods that are high in solid fats, added sugars, or salt. Exercise. Most adults should do the following: Exercise for at least 150 minutes each week. The exercise should increase your heart rate and make you sweat (moderate-intensity exercise). Strengthening exercises at least twice a week. Cut down on caffeine, tobacco, alcohol, and other potentially harmful substances. Get the right amount and quality of sleep. Most adults need 7-9 hours of sleep each night. Make choices that simplify your life. Take over-the-counter and prescription medicines only as told by your health care provider. Avoid caffeine, alcohol, and certain over-the-counter cold medicines. These may make you feel worse. Ask your pharmacist which medicines to avoid. Keep all follow-up visits as told by your health care  provider. This is important. Questions to ask your health care provider Would I benefit from therapy? How often should I follow up with a health care provider? How long do I need to take medicine? Are there any long-term side effects of my medicine? Are there any alternatives to taking medicine? Contact a health care provider if: You have a hard time staying focused or finishing daily tasks. You spend many hours a day feeling worried about everyday life. You become exhausted by worry. You start to have headaches, feel tense, or have nausea. You urinate more than normal. You have diarrhea. Get help right away if: You have a racing heart and shortness of breath. You have thoughts of hurting yourself  or others. If you ever feel like you may hurt yourself or others, or have thoughts about taking your own life, get help right away. You can go to your nearest emergency department or call: Your local emergency services (911 in the [place] A suicide crisis helpline, such as the National Suicide Prevention Lifeline at 612-384-3845. This is open 24-hours a day. Summary Taking steps to deal with stress can help calm you. Medicines cannot cure anxiety disorders, but they can help ease symptoms. Family, friends, and partners can play a big part in helping you recover from an anxiety disorder. This information is not intended to replace advice given to you by your health care provider. Make sure you discuss any questions you have with your health care provider. Document Released: 12/22/2015 Document Revised: 12/22/2015 Document Reviewed: 12/22/2015 Elsevier Interactive Patient Education  2019 [employer].  Continue to drink plenty of water and follow Mediterranean diet. Continue to walk daily. Referral to Behavioral Health placed. Alprazolam 0.25mg  prescription sent in- please use sparingly. Please schedule complete physical in 5 months, fasting lab appt the week prior. WELCOME TO THE PRACTICE! HAPPY HOLIDAYS!

## 2018-01-04 NOTE — Assessment & Plan Note (Signed)
[place] Cont[place] Substance Database reviewed- no aberrancies noted Referral to Behavioral Health placed. Alprazolam 0.25mg  prescription sent in- please use sparingly. Continue regular exercise

## 2018-01-04 NOTE — Assessment & Plan Note (Signed)
Continue to drink plenty of water and follow Mediterranean diet. Continue to walk daily. Referral to Behavioral Health placed. Alprazolam 0.25mg  prescription sent in- please use sparingly. Please schedule complete physical in 5 months, fasting lab appt the week prior.

## 2018-01-31 ENCOUNTER — Ambulatory Visit (INDEPENDENT_AMBULATORY_CARE_PROVIDER_SITE_OTHER): Payer: 59 | Admitting: Psychology

## 2018-01-31 DIAGNOSIS — F4322 Adjustment disorder with anxiety: Secondary | ICD-10-CM

## 2018-02-19 ENCOUNTER — Ambulatory Visit (INDEPENDENT_AMBULATORY_CARE_PROVIDER_SITE_OTHER): Payer: 59 | Admitting: Psychology

## 2018-02-19 DIAGNOSIS — F4322 Adjustment disorder with anxiety: Secondary | ICD-10-CM | POA: Diagnosis not present

## 2018-02-24 ENCOUNTER — Telehealth: Payer: 59 | Admitting: Family

## 2018-02-24 DIAGNOSIS — Z719 Counseling, unspecified: Secondary | ICD-10-CM

## 2018-02-24 NOTE — Progress Notes (Signed)
Thank you for the details you included in the comment boxes. Those details are very helpful in determining the best course of treatment for you and help [place] to provide the best care.  We had a pediatric e-visit program temporarily as a trial run but there was not enough demand for [facility] to keep it. So, the e-visit program is only licensed to treat 18 and older. If we still had it, I would have asked you to do it under her name. Unfortunately, given that it is your daughter but on your account, it is actually against federal law to treat a person on another account. I wish I had better news, yet this is the situation due to health care laws. I see you are out of town. I'm going to send a "face to face" thing below, but of course you would aim to take her somewhere locally for a physical exam.  Based on what you shared with me it looks like you have a serious condition that should be evaluated in a face to face office visit.  NOTE: If you entered your credit card information for this eVisit, you will not be charged. You may see a "hold" on your card for the $30 but that hold will drop off and you will not have a charge processed.  If you are having a true medical emergency please call 911.  If you need an urgent face to face visit, [facility] has four urgent care centers for your convenience.  If you need care fast and have a high deductible or no insurance consider:   [email or web address] to reserve your spot online an avoid wait times  [facility] [address] [place], [place] [address] 8 am to 8 pm Monday-Friday 10 am to 4 pm Saturday-Sunday *Across the street from [employer]  [address] [place], [address] 8 am to 5 pm Monday-Friday * In the [facility] on the [facility]   The following sites will take your  insurance:  . [facility]  [phone] Get Driving Directions Find a Provider at this  Location  [address] [place], [place] [address] . 10 am to 8 pm Monday-Friday . 12 pm to 8 pm Saturday-Sunday   . [facility] at [facility]  [phone] Get Driving Directions Find a Provider at this Location  [address] [place], [place] [address] . 8 am to 8 pm Monday-Friday . 9 am to 6 pm Saturday . 11 am to 6 pm Sunday   . [facility] at [facility]  [phone] Get Driving Directions  [address] [place], [place] [address] . 8 am to 8 pm Monday-Friday . 8 am to 4 pm Saturday-Sunday   Your e-visit answers were reviewed by a board certified advanced clinical practitioner to complete your personal care plan.  Thank you for using e-Visits.

## 2018-03-05 ENCOUNTER — Ambulatory Visit (INDEPENDENT_AMBULATORY_CARE_PROVIDER_SITE_OTHER): Payer: 59 | Admitting: Psychology

## 2018-03-05 DIAGNOSIS — Z3009 Encounter for other general counseling and advice on contraception: Secondary | ICD-10-CM | POA: Diagnosis not present

## 2018-03-05 DIAGNOSIS — F4322 Adjustment disorder with anxiety: Secondary | ICD-10-CM | POA: Diagnosis not present

## 2018-03-05 DIAGNOSIS — Z30432 Encounter for removal of intrauterine contraceptive device: Secondary | ICD-10-CM | POA: Diagnosis not present

## 2018-03-05 MED FILL — NORETHIN-ESTRAD-FERR 1-0.02: 1-20 | 84 days supply | Qty: 84 | Fill #0

## 2018-04-09 ENCOUNTER — Ambulatory Visit: Payer: 59 | Admitting: Psychology

## 2018-04-23 ENCOUNTER — Ambulatory Visit: Payer: 59 | Admitting: Psychology

## 2018-06-02 ENCOUNTER — Telehealth: Payer: 59 | Admitting: Physician Assistant

## 2018-06-02 DIAGNOSIS — R21 Rash and other nonspecific skin eruption: Secondary | ICD-10-CM

## 2018-06-02 NOTE — Progress Notes (Signed)
I have spent 5 minutes in review of e-visit questionnaire, review and updating patient chart, medical decision making and response to patient.   Raia Amico Cody Zylah Elsbernd, PA-C    

## 2018-06-02 NOTE — Progress Notes (Signed)
Message sent to patient requesting further input regarding current symptoms. Awaiting patient response.  

## 2018-06-02 NOTE — Progress Notes (Signed)
E Visit for Rash  We are sorry that you are not feeling well. Here is how we plan to help!  There are a couple of spots that look like a mild insect/mite bite but others that just look like very mild hives and allergic inflammation.  I recommend you take Benadryl 25 mg - 50 mg every 4 hours to control the symptoms. Keep skin cool and dry. Wash linens that you have used (bed linens especially) to be on the safe side. Follow-up with your Primary Care this week in-office or via video visit if symptoms are not resolving.   HOME CARE:   Take cool showers and avoid direct sunlight.  Apply cool compress or wet dressings.  Take a bath in an oatmeal bath.  Sprinkle content of one Aveeno packet under running faucet with comfortably warm water.  Bathe for 15-20 minutes, 1-2 times daily.  Pat dry with a towel. Do not rub the rash.  Use hydrocortisone cream.  Take an antihistamine like Benadryl for widespread rashes that itch.  The adult dose of Benadryl is 25-50 mg by mouth 4 times daily.  Caution:  This type of medication may cause sleepiness.  Do not drink alcohol, drive, or operate dangerous machinery while taking antihistamines.  Do not take these medications if you have prostate enlargement.  Read package instructions thoroughly on all medications that you take.  GET HELP RIGHT AWAY IF:   Symptoms don't go away after treatment.  Severe itching that persists.  If you rash spreads or swells.  If you rash begins to smell.  If it blisters and opens or develops a yellow-brown crust.  You develop a fever.  You have a sore throat.  You become short of breath.  MAKE SURE YOU:  Understand these instructions. Will watch your condition. Will get help right away if you are not doing well or get worse.  Thank you for choosing an e-visit. Your e-visit answers were reviewed by a board certified advanced clinical practitioner to complete your personal care plan. Depending upon the condition,  your plan could have included both over the counter or prescription medications. Please review your pharmacy choice. Be sure that the pharmacy you have chosen is open so that you can pick up your prescription now.  If there is a problem you may message your provider in MyChart to have the prescription routed to another pharmacy. Your safety is important to [place]. If you have drug allergies check your prescription carefully.  For the next 24 hours, you can use MyChart to ask questions about today's visit, request a non-urgent call back, or ask for a work or school excuse from your e-visit provider. You will get an email in the next two days asking about your experience. I hope that your e-visit has been valuable and will speed your recovery.

## 2018-07-12 DIAGNOSIS — N911 Secondary amenorrhea: Secondary | ICD-10-CM | POA: Diagnosis not present

## 2018-07-15 ENCOUNTER — Encounter (HOSPITAL_COMMUNITY): Payer: Self-pay | Admitting: *Deleted

## 2018-07-15 ENCOUNTER — Inpatient Hospital Stay (HOSPITAL_COMMUNITY): Payer: 59

## 2018-07-15 ENCOUNTER — Inpatient Hospital Stay (HOSPITAL_COMMUNITY)
Admission: AD | Admit: 2018-07-15 | Discharge: 2018-07-15 | Disposition: A | Discharge status: Home / Self Care | Payer: 59 | Attending: Obstetrics and Gynecology | Admitting: Obstetrics and Gynecology

## 2018-07-15 ENCOUNTER — Other Ambulatory Visit: Payer: Self-pay

## 2018-07-15 DIAGNOSIS — O208 Other hemorrhage in early pregnancy: Secondary | ICD-10-CM | POA: Diagnosis not present

## 2018-07-15 DIAGNOSIS — O209 Hemorrhage in early pregnancy, unspecified: Secondary | ICD-10-CM | POA: Diagnosis not present

## 2018-07-15 DIAGNOSIS — R109 Unspecified abdominal pain: Secondary | ICD-10-CM | POA: Diagnosis not present

## 2018-07-15 DIAGNOSIS — O468X1 Other antepartum hemorrhage, first trimester: Secondary | ICD-10-CM | POA: Diagnosis not present

## 2018-07-15 DIAGNOSIS — Z3A08 8 weeks gestation of pregnancy: Secondary | ICD-10-CM | POA: Diagnosis not present

## 2018-07-15 DIAGNOSIS — O26899 Other specified pregnancy related conditions, unspecified trimester: Secondary | ICD-10-CM

## 2018-07-15 DIAGNOSIS — O418X10 Other specified disorders of amniotic fluid and membranes, first trimester, not applicable or unspecified: Secondary | ICD-10-CM

## 2018-07-15 DIAGNOSIS — O26891 Other specified pregnancy related conditions, first trimester: Secondary | ICD-10-CM

## 2018-07-15 DIAGNOSIS — Z3A01 Less than 8 weeks gestation of pregnancy: Secondary | ICD-10-CM | POA: Diagnosis not present

## 2018-07-15 LAB — URINALYSIS, ROUTINE W REFLEX MICROSCOPIC
Bacteria, UA: NONE SEEN
Bilirubin Urine: NEGATIVE
Glucose, UA: NEGATIVE mg/dL
Ketones, ur: NEGATIVE mg/dL
Leukocytes,Ua: NEGATIVE
Nitrite: NEGATIVE
Protein, ur: 100 mg/dL — AB
RBC / HPF: >50 RBC/hpf — ABNORMAL HIGH (ref 0–5)
Specific Gravity, Urine: 1.011 (ref 1.005–1.030)
pH: 6 (ref 5.0–8.0)

## 2018-07-15 LAB — CBC WITH DIFFERENTIAL/PLATELET
Abs Immature Granulocytes: 0.01 10*3/uL (ref 0.00–0.07)
Basophils Absolute: 0 10*3/uL (ref 0.0–0.1)
Basophils Relative: 1 %
Eosinophils Absolute: 0 10*3/uL (ref 0.0–0.5)
Eosinophils Relative: 1 %
HCT: 37.9 % (ref 36.0–46.0)
Hemoglobin: 13.4 g/dL (ref 12.0–15.0)
Immature Granulocytes: 0 %
Lymphocytes Relative: 22 %
Lymphs Abs: 1.3 10*3/uL (ref 0.7–4.0)
MCH: 30.5 pg (ref 26.0–34.0)
MCHC: 35.4 g/dL (ref 30.0–36.0)
MCV: 86.3 fL (ref 80.0–100.0)
Monocytes Absolute: 0.4 10*3/uL (ref 0.1–1.0)
Monocytes Relative: 8 %
Neutro Abs: 4 10*3/uL (ref 1.7–7.7)
Neutrophils Relative %: 68 %
Platelets: 196 10*3/uL (ref 150–400)
RBC: 4.39 MIL/uL (ref 3.87–5.11)
RDW: 12 % (ref 11.5–15.5)
WBC: 5.8 10*3/uL (ref 4.0–10.5)
nRBC: 0 % (ref 0.0–0.2)

## 2018-07-15 LAB — HCG, QUANTITATIVE, PREGNANCY: hCG, Beta Chain, Quant, S: 32264 m[IU]/mL — ABNORMAL HIGH (ref <5)

## 2018-07-15 LAB — POCT PREGNANCY, URINE: Preg Test, Ur: POSITIVE — AB

## 2018-07-15 NOTE — Discharge Instructions (Signed)
Subchorionic Hematoma ° °A subchorionic hematoma is a gathering of blood between the outer wall of the embryo (chorion) and the inner wall of the womb (uterus). °This condition can cause vaginal bleeding. If they cause little or no vaginal bleeding, early small hematomas usually shrink on their own and do not affect your baby or pregnancy. When bleeding starts later in pregnancy, or if the hematoma is larger or occurs in older pregnant women, the condition may be more serious. Larger hematomas may get bigger, which increases the chances of miscarriage. This condition also increases the risk of: °· Premature separation of the placenta from the uterus. °· Premature (preterm) labor. °· Stillbirth. °What are the causes? °The exact cause of this condition is not known. It occurs when blood is trapped between the placenta and the uterine wall because the placenta has separated from the original site of implantation. °What increases the risk? °You are more likely to develop this condition if: °· You were treated with fertility medicines. °· You conceived through in vitro fertilization (IVF). °What are the signs or symptoms? °Symptoms of this condition include: °· Vaginal spotting or bleeding. °· Contractions of the uterus. These cause abdominal pain. °Sometimes you may have no symptoms and the bleeding may only be seen when ultrasound images are taken (transvaginal ultrasound). °How is this diagnosed? °This condition is diagnosed based on a physical exam. This includes a pelvic exam. You may also have other tests, including: °· Blood tests. °· Urine tests. °· Ultrasound of the abdomen. °How is this treated? °Treatment for this condition can vary. Treatment may include: °· Watchful waiting. You will be monitored closely for any changes in bleeding. During this stage: °? The hematoma may be reabsorbed by the body. °? The hematoma may separate the fluid-filled space containing the embryo (gestational sac) from the wall of the  womb (endometrium). °· Medicines. °· Activity restriction. This may be needed until the bleeding stops. °Follow these instructions at home: °· Stay on bed rest if told to do so by your health care provider. °· Do not lift anything that is heavier than 10 lbs. (4.5 kg) or as told by your health care provider. °· Do not use any products that contain nicotine or tobacco, such as cigarettes and e-cigarettes. If you need help quitting, ask your health care provider. °· Track and write down the number of pads you use each day and how soaked (saturated) they are. °· Do not use tampons. °· Keep all follow-up visits as told by your health care provider. This is important. Your health care provider may ask you to have follow-up blood tests or ultrasound tests or both. °Contact a health care provider if: °· You have any vaginal bleeding. °· You have a fever. °Get help right away if: °· You have severe cramps in your stomach, back, abdomen, or pelvis. °· You pass large clots or tissue. Save any tissue for your health care provider to look at. °· You have more vaginal bleeding, and you faint or become lightheaded or weak. °Summary °· A subchorionic hematoma is a gathering of blood between the outer wall of the placenta and the uterus. °· This condition can cause vaginal bleeding. °· Sometimes you may have no symptoms and the bleeding may only be seen when ultrasound images are taken. °· Treatment may include watchful waiting, medicines, or activity restriction. °This information is not intended to replace advice given to you by your health care provider. Make sure you discuss any questions you   have with your health care provider. °Document Released: 04/13/2006 Document Revised: 12/09/2016 Document Reviewed: 02/23/2016 °Elsevier Patient Education © 2020 Elsevier Inc. ° °

## 2018-07-15 NOTE — MAU Provider Note (Signed)
History     CSN: [id number]  Arrival [id number] and time: 07/15/18 1204   First Provider Initiated Contact with Patient 07/15/18 1313      Chief Complaint  Patient presents with  . Vaginal Bleeding  . Abdominal Pain  . Possible Pregnancy   HPI  Ms. Danielle [name] is a 23 y.o. G2P1001 at 4048w3d by LMP who presents to MAU today with complaint of vaginal bleeding and lower abdominal cramping since this morning. The patient states pain is rated at 3/10. She has not taken anything for pain. She has used one pad today, but had "a lot" of blood come out in the toilet. She was seen in the office last week and had ultrasound which dated her at 7931w5d with fetal bradycardia. She denies fever, UTI symptoms or abnormal discharge. She states a history of irregular periods.   OB History    Gravida  2   Para  1   Term  1   Preterm      AB      Living  1     SAB      TAB      Ectopic      Multiple  0   Live Births  1           Past Medical History:  Diagnosis Date  . Anxiety   . Medical history non-contributory     Past Surgical History:  Procedure Laterality Date  . NO PAST SURGERIES      Family History  Problem Relation Age of Onset  . Skin cancer Mother   . Depression Father     Social History   Tobacco Use  . Smoking status: Never Smoker  . Smokeless tobacco: Never Used  Substance Use Topics  . Alcohol use: Not Currently    Alcohol/week: 1.0 standard drinks    Types: 1 Glasses of wine per week  . Drug use: No    Allergies: No Known Allergies  Medications Prior to Admission  Medication Sig Dispense Refill Last Dose  . ALPRAZolam (XANAX) 0.25 MG tablet Take 1 tablet (0.25 mg total) by mouth 2 (two) times daily as needed for anxiety. 20 tablet 0   . levonorgestrel (MIRENA) 20 MCG/24HR IUD 1 each by Intrauterine route once.       Review of Systems  Constitutional: Negative for fever.  Gastrointestinal: Positive for abdominal pain. Negative for  constipation, diarrhea, nausea and vomiting.  Genitourinary: Positive for vaginal bleeding. Negative for dysuria, frequency, urgency and vaginal discharge.   Physical Exam   Blood pressure 115/74, pulse 94, temperature 98.1 F (36.7 C), temperature source Oral, resp. rate 17, weight 60.3 kg, last menstrual period 05/17/2018, SpO2 100 %, unknown if currently breastfeeding.  Physical Exam  Nursing note and vitals reviewed. Constitutional: She is oriented to person, place, and time. She appears well-developed and well-nourished. No distress.  HENT:  Head: Normocephalic and atraumatic.  Cardiovascular: Normal rate.  Respiratory: Effort normal.  GI: Soft. She exhibits no distension and no mass. There is abdominal tenderness (mild epigastric tenderness to palpation). There is no rebound and no guarding.  Genitourinary: Uterus is enlarged (slightly). Uterus is not tender. Cervix exhibits no motion tenderness, no discharge and no friability. Right adnexum displays no mass and no tenderness. Left adnexum displays no mass and no tenderness.    Vaginal bleeding (small) present.     No vaginal discharge.  There is bleeding (small) in the vagina.  Neurological: She is alert and oriented  to person, place, and time.  Skin: Skin is warm and dry. No erythema.  Psychiatric: She has a normal mood and affect.  Dilation: Closed Effacement (%): Thick Cervical Position: Posterior Exam by:: Kerry Hough, PA-C   Results for orders placed or performed during the hospital encounter of 07/15/18 (from the past 24 hour(s))  Pregnancy, urine POC     Status: Abnormal   Collection Time: 07/15/18 12:22 PM  Result Value Ref Range   Preg Test, Ur POSITIVE (A) NEGATIVE  Urinalysis, Routine w reflex microscopic     Status: Abnormal   Collection Time: 07/15/18 12:55 PM  Result Value Ref Range   Color, Urine AMBER (A) YELLOW   APPearance HAZY (A) CLEAR   Specific Gravity, Urine 1.011 1.005 - 1.030   pH 6.0 5.0 - 8.0    Glucose, UA NEGATIVE NEGATIVE mg/dL   Hgb urine dipstick LARGE (A) NEGATIVE   Bilirubin Urine NEGATIVE NEGATIVE   Ketones, ur NEGATIVE NEGATIVE mg/dL   Protein, ur 100 (A) NEGATIVE mg/dL   Nitrite NEGATIVE NEGATIVE   Leukocytes,Ua NEGATIVE NEGATIVE   RBC / HPF >50 (H) 0 - 5 RBC/hpf   WBC, UA 6-10 0 - 5 WBC/hpf   Bacteria, UA NONE SEEN NONE SEEN   Squamous Epithelial / LPF 0-5 0 - 5  CBC with Differential/Platelet     Status: None   Collection Time: 07/15/18  1:33 PM  Result Value Ref Range   WBC 5.8 4.0 - 10.5 K/uL   RBC 4.39 3.87 - 5.11 MIL/uL   Hemoglobin 13.4 12.0 - 15.0 g/dL   HCT 37.9 36.0 - 46.0 %   MCV 86.3 80.0 - 100.0 fL   MCH 30.5 26.0 - 34.0 pg   MCHC 35.4 30.0 - 36.0 g/dL   RDW 12.0 11.5 - 15.5 %   Platelets 196 150 - 400 K/uL   nRBC 0.0 0.0 - 0.2 %   Neutrophils Relative % 68 %   Neutro Abs 4.0 1.7 - 7.7 K/uL   Lymphocytes Relative 22 %   Lymphs Abs 1.3 0.7 - 4.0 K/uL   Monocytes Relative 8 %   Monocytes Absolute 0.4 0.1 - 1.0 K/uL   Eosinophils Relative 1 %   Eosinophils Absolute 0.0 0.0 - 0.5 K/uL   Basophils Relative 1 %   Basophils Absolute 0.0 0.0 - 0.1 K/uL   Immature Granulocytes 0 %   Abs Immature Granulocytes 0.01 0.00 - 0.07 K/uL  hCG, quantitative, pregnancy     Status: Abnormal   Collection Time: 07/15/18  1:33 PM  Result Value Ref Range   hCG, Beta Chain, Quant, S 32,264 (H) <5 mIU/mL   US Ob Comp Less 14 Wks  Result Date: 07/15/2018 CLINICAL DATA:  Pregnant patient with bleeding. EXAM: OBSTETRIC <14 WK [place] AND TRANSVAGINAL OB US TECHNIQUE: Both transabdominal and transvaginal ultrasound examinations were performed for complete evaluation of the gestation as well as the maternal uterus, adnexal regions, and pelvic cul-de-sac. Transvaginal technique was performed to assess early pregnancy. COMPARISON:  None. FINDINGS: Intrauterine gestational sac: Single Yolk sac:  Visualized. Embryo:  Visualized. Cardiac Activity: Visualized. Heart Rate: 147 bpm  MSD:   mm    w     d CRL:  11.9 mm   7 w   2 d                  [place] [facility]: December 29, 2019 Subchorionic hemorrhage: There is a large subchorionic hemorrhage measuring 3.6 x 2.2 cm. Maternal uterus/adnexae:  The ovaries are normal in appearance. There is an adnexal cyst measuring up to 2.4 cm. Of note, the yolk sac measures 7.1 x 6.5 mm. IMPRESSION: 1. A single live IUP is identified. 2. The yolk sac measures 7.1 mm. Given a yolk sac measuring greater than 7 mm, the pregnancy is suspicious for non viability. Findings are suspicious but not yet definitive for failed pregnancy. Recommend follow-up US in 10-14 days for definitive diagnosis. This recommendation follows SRU consensus guidelines: Diagnostic Criteria for Nonviable Pregnancy Early in the First Trimester. Malva Limes Engl J Med 2013; 161:0960-45; 369:1443-51. 3. Large subchorionic hemorrhage measuring 3.6 x 2.2 cm. 4. There is a 2.4 cm simple paraovarian cyst of doubtful significance. Electronically Signed   By: Gerome Samavid  Williams III M.D   On: 07/15/2018 14:36   [place] Ob Transvaginal  Result Date: 07/15/2018 CLINICAL DATA:  Pregnant patient with bleeding. EXAM: OBSTETRIC <14 WK US AND TRANSVAGINAL OB US TECHNIQUE: Both transabdominal and transvaginal ultrasound examinations were performed for complete evaluation of the gestation as well as the maternal uterus, adnexal regions, and pelvic cul-de-sac. Transvaginal technique was performed to assess early pregnancy. COMPARISON:  None. FINDINGS: Intrauterine gestational sac: Single Yolk sac:  Visualized. Embryo:  Visualized. Cardiac Activity: Visualized. Heart Rate: 147 bpm MSD:   mm    w     d CRL:  11.9 mm   7 w   2 d                  [facility] Subchorionic hemorrhage: There is a large subchorionic hemorrhage measuring 3.6 x 2.2 cm. Maternal uterus/adnexae: The ovaries are normal in appearance. There is an adnexal cyst measuring up to 2.4 cm. Of note, the yolk sac measures 7.1 x 6.5 mm. IMPRESSION: 1. A single live IUP is  identified. 2. The yolk sac measures 7.1 mm. Given a yolk sac measuring greater than 7 mm, the pregnancy is suspicious for non viability. Findings are suspicious but not yet definitive for failed pregnancy. Recommend follow-up US in 10-14 days for definitive diagnosis. This recommendation follows SRU consensus guidelines: Diagnostic Criteria for Nonviable Pregnancy Early in the First Trimester. Malva Limes Engl J Med 2013; 409:8119-14; 369:1443-51. 3. Large subchorionic hemorrhage measuring 3.6 x 2.2 cm. 4. There is a 2.4 cm simple paraovarian cyst of doubtful significance. Electronically Signed   By: Gerome Samavid  Williams III M.D   On: 07/15/2018 14:36     MAU Course  Procedures None  MDM +UPT UA, CBC, quant hCG and US today  O+ blood type  Assessment and Plan  A: SIUP at 779w2d Large subchorionic hemorrhage  Vaginal bleeding in first trimester   P: Discharge home Bleeding precautions and pelvic rest discussed Patient advised to avoid heavy lifting or strenuous activity  Patient advised to follow-up with [facility] as scheduled on Thursday  Patient may return to MAU as needed or if her condition were to change or worsen   Vonzella NippleJulie Chandrea Zellman, PA-C 07/15/2018, 2:49 PM

## 2018-07-15 NOTE — MAU Note (Signed)
Danielle [name] is a 23 y.o. at Unknown here in MAU reporting:  "possible miscarriage" +vaginal bleeding +lower abdominal cramping LMP: unsure.  Patient states this past Thursday she had an ultrasound that dated her about 6.5weeks. reports the heart rate was low. They scheduled her for a repeat ultrasound this coming Thursday. Onset of complaint: about 1.5 hours ago patient stated she having vaginal bleeding equivalent to a period. States initially when she sat on the toilet " Blood just kept coming out". +clots Pain score: 3/10 Vitals:   07/15/18 1229  BP: 115/74  Pulse: 94  Resp: 17  Temp: 98.1 F (36.7 C)  SpO2: 100%     Lab orders placed from triage: ua and poc

## 2018-07-19 DIAGNOSIS — N911 Secondary amenorrhea: Secondary | ICD-10-CM | POA: Diagnosis not present

## 2018-07-26 DIAGNOSIS — Z3685 Encounter for antenatal screening for Streptococcus B: Secondary | ICD-10-CM | POA: Diagnosis not present

## 2018-07-26 DIAGNOSIS — Z31430 Encounter of female for testing for genetic disease carrier status for procreative management: Secondary | ICD-10-CM | POA: Diagnosis not present

## 2018-07-26 DIAGNOSIS — Z3482 Encounter for supervision of other normal pregnancy, second trimester: Secondary | ICD-10-CM | POA: Diagnosis not present

## 2018-07-26 DIAGNOSIS — Z319 Encounter for procreative management, unspecified: Secondary | ICD-10-CM | POA: Diagnosis not present

## 2018-07-26 DIAGNOSIS — Z331 Pregnant state, incidental: Secondary | ICD-10-CM | POA: Diagnosis not present

## 2018-07-26 DIAGNOSIS — Z113 Encounter for screening for infections with a predominantly sexual mode of transmission: Secondary | ICD-10-CM | POA: Diagnosis not present

## 2018-07-26 DIAGNOSIS — Z3481 Encounter for supervision of other normal pregnancy, first trimester: Secondary | ICD-10-CM | POA: Diagnosis not present

## 2018-08-14 MED FILL — DOXYLAMINE-PYRIDOXINE 10-10: 10-10 | 30 days supply | Qty: 90 | Fill #0

## 2018-08-16 DIAGNOSIS — Z3481 Encounter for supervision of other normal pregnancy, first trimester: Secondary | ICD-10-CM | POA: Diagnosis not present

## 2018-08-16 DIAGNOSIS — Z113 Encounter for screening for infections with a predominantly sexual mode of transmission: Secondary | ICD-10-CM | POA: Diagnosis not present

## 2018-08-16 DIAGNOSIS — Z331 Pregnant state, incidental: Secondary | ICD-10-CM | POA: Diagnosis not present

## 2018-08-22 DIAGNOSIS — Z36 Encounter for antenatal screening for chromosomal anomalies: Secondary | ICD-10-CM | POA: Diagnosis not present

## 2018-08-22 DIAGNOSIS — Z3A12 12 weeks gestation of pregnancy: Secondary | ICD-10-CM | POA: Diagnosis not present

## 2018-08-28 MED FILL — DOXYLAMINE-PYRIDOXINE 10-10: 10-10 | 30 days supply | Qty: 120 | Fill #0

## 2018-10-04 DIAGNOSIS — Z3492 Encounter for supervision of normal pregnancy, unspecified, second trimester: Secondary | ICD-10-CM | POA: Diagnosis not present

## 2018-10-04 DIAGNOSIS — Z3A18 18 weeks gestation of pregnancy: Secondary | ICD-10-CM | POA: Diagnosis not present

## 2018-10-04 DIAGNOSIS — Z363 Encounter for antenatal screening for malformations: Secondary | ICD-10-CM | POA: Diagnosis not present

## 2018-10-08 ENCOUNTER — Other Ambulatory Visit (HOSPITAL_COMMUNITY): Payer: Self-pay | Admitting: Obstetrics & Gynecology

## 2018-10-08 DIAGNOSIS — Q234 Hypoplastic left heart syndrome: Secondary | ICD-10-CM

## 2018-10-09 ENCOUNTER — Telehealth (HOSPITAL_COMMUNITY): Payer: Self-pay | Admitting: *Deleted

## 2018-10-10 ENCOUNTER — Encounter (HOSPITAL_COMMUNITY): Payer: Self-pay

## 2018-10-11 DIAGNOSIS — O359XX0 Maternal care for (suspected) fetal abnormality and damage, unspecified, not applicable or unspecified: Secondary | ICD-10-CM | POA: Diagnosis not present

## 2018-10-11 DIAGNOSIS — Z3A19 19 weeks gestation of pregnancy: Secondary | ICD-10-CM | POA: Diagnosis not present

## 2018-10-15 ENCOUNTER — Ambulatory Visit (HOSPITAL_COMMUNITY)
Admission: RE | Admit: 2018-10-15 | Discharge: 2018-10-15 | Disposition: A | Discharge status: Home / Self Care | Payer: 59 | Source: Ambulatory Visit | Attending: Obstetrics and Gynecology | Admitting: Obstetrics and Gynecology

## 2018-10-15 ENCOUNTER — Ambulatory Visit (HOSPITAL_COMMUNITY): Payer: 59 | Admitting: *Deleted

## 2018-10-15 ENCOUNTER — Other Ambulatory Visit: Payer: Self-pay

## 2018-10-15 ENCOUNTER — Encounter (HOSPITAL_COMMUNITY): Payer: Self-pay

## 2018-10-15 VITALS — BP 98/57 | HR 89 | Temp 98.2°F

## 2018-10-15 DIAGNOSIS — Q234 Hypoplastic left heart syndrome: Secondary | ICD-10-CM | POA: Diagnosis not present

## 2018-10-15 DIAGNOSIS — O418X90 Other specified disorders of amniotic fluid and membranes, unspecified trimester, not applicable or unspecified: Secondary | ICD-10-CM

## 2018-10-15 DIAGNOSIS — Z3A20 20 weeks gestation of pregnancy: Secondary | ICD-10-CM | POA: Diagnosis not present

## 2018-10-15 DIAGNOSIS — O468X9 Other antepartum hemorrhage, unspecified trimester: Secondary | ICD-10-CM | POA: Diagnosis not present

## 2018-10-15 DIAGNOSIS — O359XX0 Maternal care for (suspected) fetal abnormality and damage, unspecified, not applicable or unspecified: Secondary | ICD-10-CM | POA: Diagnosis not present

## 2018-10-15 DIAGNOSIS — O358XX0 Maternal care for other (suspected) fetal abnormality and damage, not applicable or unspecified: Secondary | ICD-10-CM | POA: Diagnosis not present

## 2018-10-16 ENCOUNTER — Other Ambulatory Visit (HOSPITAL_COMMUNITY): Payer: Self-pay | Admitting: *Deleted

## 2018-10-16 DIAGNOSIS — Q249 Congenital malformation of heart, unspecified: Secondary | ICD-10-CM

## 2018-10-22 ENCOUNTER — Ambulatory Visit (HOSPITAL_COMMUNITY): Payer: 59

## 2018-11-06 ENCOUNTER — Other Ambulatory Visit (HOSPITAL_COMMUNITY): Payer: Self-pay | Admitting: Obstetrics and Gynecology

## 2018-11-06 ENCOUNTER — Encounter (HOSPITAL_COMMUNITY): Payer: Self-pay | Admitting: Obstetrics and Gynecology

## 2018-11-06 DIAGNOSIS — O359XX0 Maternal care for (suspected) fetal abnormality and damage, unspecified, not applicable or unspecified: Secondary | ICD-10-CM | POA: Diagnosis not present

## 2018-11-06 DIAGNOSIS — O358XX0 Maternal care for other (suspected) fetal abnormality and damage, not applicable or unspecified: Secondary | ICD-10-CM | POA: Diagnosis not present

## 2018-11-06 DIAGNOSIS — Z3A28 28 weeks gestation of pregnancy: Secondary | ICD-10-CM | POA: Diagnosis not present

## 2018-11-06 DIAGNOSIS — O0992 Supervision of high risk pregnancy, unspecified, second trimester: Secondary | ICD-10-CM | POA: Diagnosis not present

## 2018-11-13 ENCOUNTER — Ambulatory Visit (HOSPITAL_COMMUNITY): Payer: 59

## 2018-11-13 ENCOUNTER — Encounter (HOSPITAL_COMMUNITY): Payer: Self-pay

## 2018-11-29 ENCOUNTER — Encounter (HOSPITAL_COMMUNITY): Payer: Self-pay | Admitting: Obstetrics and Gynecology

## 2018-11-29 DIAGNOSIS — Z3A26 26 weeks gestation of pregnancy: Secondary | ICD-10-CM | POA: Diagnosis not present

## 2018-11-29 DIAGNOSIS — O358XX0 Maternal care for other (suspected) fetal abnormality and damage, not applicable or unspecified: Secondary | ICD-10-CM | POA: Diagnosis not present

## 2018-12-03 DIAGNOSIS — Z3480 Encounter for supervision of other normal pregnancy, unspecified trimester: Secondary | ICD-10-CM | POA: Diagnosis not present

## 2018-12-03 DIAGNOSIS — Z3483 Encounter for supervision of other normal pregnancy, third trimester: Secondary | ICD-10-CM | POA: Diagnosis not present

## 2018-12-03 DIAGNOSIS — Z3A29 29 weeks gestation of pregnancy: Secondary | ICD-10-CM | POA: Diagnosis not present

## 2018-12-03 DIAGNOSIS — Z3482 Encounter for supervision of other normal pregnancy, second trimester: Secondary | ICD-10-CM | POA: Diagnosis not present

## 2018-12-03 DIAGNOSIS — Z23 Encounter for immunization: Secondary | ICD-10-CM | POA: Diagnosis not present

## 2018-12-03 DIAGNOSIS — O358XX0 Maternal care for other (suspected) fetal abnormality and damage, not applicable or unspecified: Secondary | ICD-10-CM | POA: Diagnosis not present

## 2018-12-11 DIAGNOSIS — Z3A28 28 weeks gestation of pregnancy: Secondary | ICD-10-CM | POA: Diagnosis not present

## 2018-12-11 DIAGNOSIS — O359XX0 Maternal care for (suspected) fetal abnormality and damage, unspecified, not applicable or unspecified: Secondary | ICD-10-CM | POA: Diagnosis not present

## 2018-12-11 DIAGNOSIS — O358XX0 Maternal care for other (suspected) fetal abnormality and damage, not applicable or unspecified: Secondary | ICD-10-CM | POA: Diagnosis not present

## 2018-12-21 DIAGNOSIS — O358XX0 Maternal care for other (suspected) fetal abnormality and damage, not applicable or unspecified: Secondary | ICD-10-CM | POA: Diagnosis not present

## 2018-12-21 DIAGNOSIS — Z3A29 29 weeks gestation of pregnancy: Secondary | ICD-10-CM | POA: Diagnosis not present

## 2019-01-09 DIAGNOSIS — Z3A32 32 weeks gestation of pregnancy: Secondary | ICD-10-CM | POA: Diagnosis not present

## 2019-01-09 DIAGNOSIS — O358XX0 Maternal care for other (suspected) fetal abnormality and damage, not applicable or unspecified: Secondary | ICD-10-CM | POA: Diagnosis not present

## 2019-01-10 ENCOUNTER — Encounter (HOSPITAL_COMMUNITY): Payer: Self-pay | Admitting: Obstetrics and Gynecology

## 2019-01-10 DIAGNOSIS — O358XX0 Maternal care for other (suspected) fetal abnormality and damage, not applicable or unspecified: Secondary | ICD-10-CM | POA: Diagnosis not present

## 2019-01-10 DIAGNOSIS — Z3A32 32 weeks gestation of pregnancy: Secondary | ICD-10-CM | POA: Diagnosis not present

## 2019-01-31 DIAGNOSIS — Z113 Encounter for screening for infections with a predominantly sexual mode of transmission: Secondary | ICD-10-CM | POA: Diagnosis not present

## 2019-01-31 DIAGNOSIS — Z3685 Encounter for antenatal screening for Streptococcus B: Secondary | ICD-10-CM | POA: Diagnosis not present

## 2019-02-08 DIAGNOSIS — Z3A36 36 weeks gestation of pregnancy: Secondary | ICD-10-CM | POA: Diagnosis not present

## 2019-02-08 DIAGNOSIS — O358XX0 Maternal care for other (suspected) fetal abnormality and damage, not applicable or unspecified: Secondary | ICD-10-CM | POA: Diagnosis not present

## 2019-02-08 DIAGNOSIS — Z283 Underimmunization status: Secondary | ICD-10-CM | POA: Diagnosis not present

## 2019-02-08 DIAGNOSIS — O99891 Other specified diseases and conditions complicating pregnancy: Secondary | ICD-10-CM | POA: Diagnosis not present

## 2019-02-08 DIAGNOSIS — O0993 Supervision of high risk pregnancy, unspecified, third trimester: Secondary | ICD-10-CM | POA: Diagnosis not present

## 2019-02-08 DIAGNOSIS — Q210 Ventricular septal defect: Secondary | ICD-10-CM | POA: Diagnosis not present

## 2019-02-13 DIAGNOSIS — Z283 Underimmunization status: Secondary | ICD-10-CM | POA: Diagnosis not present

## 2019-02-13 DIAGNOSIS — Q270 Congenital absence and hypoplasia of umbilical artery: Secondary | ICD-10-CM | POA: Diagnosis not present

## 2019-02-13 DIAGNOSIS — O99891 Other specified diseases and conditions complicating pregnancy: Secondary | ICD-10-CM | POA: Diagnosis not present

## 2019-02-13 DIAGNOSIS — O0993 Supervision of high risk pregnancy, unspecified, third trimester: Secondary | ICD-10-CM | POA: Diagnosis not present

## 2019-02-13 DIAGNOSIS — O358XX0 Maternal care for other (suspected) fetal abnormality and damage, not applicable or unspecified: Secondary | ICD-10-CM | POA: Diagnosis not present

## 2019-02-13 DIAGNOSIS — Z3A37 37 weeks gestation of pregnancy: Secondary | ICD-10-CM | POA: Diagnosis not present

## 2019-02-20 DIAGNOSIS — Q270 Congenital absence and hypoplasia of umbilical artery: Secondary | ICD-10-CM | POA: Diagnosis not present

## 2019-02-20 DIAGNOSIS — Z3A38 38 weeks gestation of pregnancy: Secondary | ICD-10-CM | POA: Diagnosis not present

## 2019-02-20 DIAGNOSIS — Z79899 Other long term (current) drug therapy: Secondary | ICD-10-CM | POA: Diagnosis not present

## 2019-02-20 DIAGNOSIS — Z20822 Contact with and (suspected) exposure to covid-19: Secondary | ICD-10-CM | POA: Diagnosis not present

## 2019-02-20 DIAGNOSIS — Z3A39 39 weeks gestation of pregnancy: Secondary | ICD-10-CM | POA: Diagnosis not present

## 2019-02-20 DIAGNOSIS — Z3483 Encounter for supervision of other normal pregnancy, third trimester: Secondary | ICD-10-CM | POA: Diagnosis not present

## 2019-02-20 DIAGNOSIS — O358XX0 Maternal care for other (suspected) fetal abnormality and damage, not applicable or unspecified: Secondary | ICD-10-CM | POA: Diagnosis not present

## 2019-02-25 MED ORDER — GENERIC EXTERNAL MEDICATION
10.00 | Status: DC
Start: ? — End: 2019-02-25

## 2019-02-25 MED ORDER — SIMETHICONE 80 MG PO CHEW
80.00 | CHEWABLE_TABLET | ORAL | Status: DC
Start: ? — End: 2019-02-25

## 2019-02-25 MED ORDER — MISOPROSTOL 200 MCG PO TABS
800.00 | ORAL_TABLET | ORAL | Status: DC
Start: ? — End: 2019-02-25

## 2019-02-25 MED ORDER — PNV PRENATAL PLUS MULTIVITAMIN 27-1 MG PO TABS
1.00 | ORAL_TABLET | ORAL | Status: DC
Start: 2019-02-27 — End: 2019-02-25

## 2019-02-25 MED ORDER — ALUM & MAG HYDROXIDE-SIMETH 400-400-40 MG/5ML PO SUSP
15.00 | ORAL | Status: DC
Start: ? — End: 2019-02-25

## 2019-02-25 MED ORDER — METHYLERGONOVINE MALEATE 0.2 MG PO TABS
0.20 | ORAL_TABLET | ORAL | Status: DC
Start: 2019-02-26 — End: 2019-02-25

## 2019-02-25 MED ORDER — ACETAMINOPHEN 325 MG PO TABS
650.00 | ORAL_TABLET | ORAL | Status: DC
Start: ? — End: 2019-02-25

## 2019-02-25 MED ORDER — WITCH HAZEL-GLYCERIN EX PADS
1.00 | MEDICATED_PAD | CUTANEOUS | Status: DC
Start: ? — End: 2019-02-25

## 2019-02-25 MED ORDER — BENZOCAINE 20 % EX AERO
1.00 | INHALATION_SPRAY | CUTANEOUS | Status: DC
Start: ? — End: 2019-02-25

## 2019-02-25 MED ORDER — GENERIC EXTERNAL MEDICATION
12.50 | Status: DC
Start: ? — End: 2019-02-25

## 2019-02-25 MED ORDER — ONDANSETRON HCL 4 MG/2ML IJ SOLN
4.00 | INTRAMUSCULAR | Status: DC
Start: ? — End: 2019-02-25

## 2019-02-25 MED ORDER — DSS 100 MG PO CAPS
100.00 | ORAL_CAPSULE | ORAL | Status: DC
Start: 2019-02-26 — End: 2019-02-25

## 2019-02-25 MED ORDER — IBUPROFEN 600 MG PO TABS
600.00 | ORAL_TABLET | ORAL | Status: DC
Start: ? — End: 2019-02-25

## 2019-02-25 MED ORDER — METHYLERGONOVINE MALEATE 0.2 MG/ML IJ SOLN
0.20 | INTRAMUSCULAR | Status: DC
Start: ? — End: 2019-02-25

## 2019-07-08 ENCOUNTER — Ambulatory Visit: Payer: Self-pay | Attending: Internal Medicine

## 2019-07-08 DIAGNOSIS — Z20822 Contact with and (suspected) exposure to covid-19: Secondary | ICD-10-CM | POA: Diagnosis not present

## 2019-07-08 DIAGNOSIS — Z03818 Encounter for observation for suspected exposure to other biological agents ruled out: Secondary | ICD-10-CM | POA: Diagnosis not present

## 2019-07-09 ENCOUNTER — Telehealth: Payer: Self-pay | Admitting: Emergency Medicine

## 2019-07-09 DIAGNOSIS — R0981 Nasal congestion: Secondary | ICD-10-CM

## 2019-07-09 LAB — NOVEL CORONAVIRUS, NAA: SARS-CoV-2, NAA: NOT DETECTED

## 2019-07-09 LAB — SARS-COV-2, NAA 2 DAY TAT

## 2019-07-09 MED ORDER — IPRATROPIUM BROMIDE 0.03 % NA SOLN: 2.0000 | Freq: Two times a day (BID) | NASAL | 0 refills | Status: DC

## 2019-07-09 MED ORDER — SALINE SPRAY 0.65 % NA SOLN: 1.0000 | NASAL | 0 refills | Status: DC | PRN

## 2019-07-09 MED FILL — IPRATROPIUM 0.03% SPRAY: 0.03 | 43 days supply | Qty: 30 | Fill #0

## 2019-07-09 NOTE — Progress Notes (Signed)
We are sorry that you are not feeling well.  Here is how we plan to help!  Based on what you have shared with me it looks like you have sinusitis.  Sinusitis is inflammation and infection in the sinus cavities of the head.  Based on your presentation I believe you most likely have Acute Viral Sinusitis.This is an infection most likely caused by a virus. There is not specific treatment for viral sinusitis other than to help you with the symptoms until the infection runs its course.  You may use an oral decongestant such as Mucinex D or if you have glaucoma or high blood pressure use plain Mucinex. Saline nasal spray help and can safely be used as often as needed for congestion, I have prescribed: Ipratropium Bromide nasal spray 0.03% 2 sprays in eah nostril 2-3 times a day   I've also sent some nasal saline.  Not the most pleasant, but it works well.  Some authorities believe that zinc sprays or the use of Echinacea may shorten the course of your symptoms.  Sinus infections are not as easily transmitted as other respiratory infection, however we still recommend that you avoid close contact with loved ones, especially the very young and elderly.  Remember to wash your hands thoroughly throughout the day as this is the number one way to prevent the spread of infection!  Home Care:  Only take medications as instructed by your medical team.  Do not take these medications with alcohol.  A steam or ultrasonic humidifier can help congestion.  You can place a towel over your head and breathe in the steam from hot water coming from a faucet.  Avoid close contacts especially the very young and the elderly.  Cover your mouth when you cough or sneeze.  Always remember to wash your hands.  Get Help Right Away If:  You develop worsening fever or sinus pain.  You develop a severe head ache or visual changes.  Your symptoms persist after you have completed your treatment plan.  Make sure  you  Understand these instructions.  Will watch your condition.  Will get help right away if you are not doing well or get worse.  Your e-visit answers were reviewed by a board certified advanced clinical practitioner to complete your personal care plan.  Depending on the condition, your plan could have included both over the counter or prescription medications.  If there is a problem please reply  once you have received a response from your provider.  Your safety is important to [place].  If you have drug allergies check your prescription carefully.    You can use MyChart to ask questions about today's visit, request a non-urgent call back, or ask for a work or school excuse for 24 hours related to this e-Visit. If it has been greater than 24 hours you will need to follow up with your provider, or enter a new e-Visit to address those concerns.  You will get an e-mail in the next two days asking about your experience.  I hope that your e-visit has been valuable and will speed your recovery. Thank you for using e-visits.   Approximately 5 minutes was used in reviewing the patient's chart, questionnaire, prescribing medications, and documentation.

## 2019-07-14 IMAGING — US OBSTETRIC <14 WK ULTRASOUND
1 series · 15 of 28 positions shown · non-contrast
Comparison: None.

CLINICAL DATA: Pregnant patient with bleeding.

EXAM:
OBSTETRIC <14 WK US AND TRANSVAGINAL OB US
TECHNIQUE: Both transabdominal and transvaginal ultrasound examinations were
performed for complete evaluation of the gestation as well as the
maternal uterus, adnexal regions, and pelvic cul-de-sac.
Transvaginal technique was performed to assess early pregnancy.

[Series 1: obstetric <14 wk ultrasound · 45 acquisitions, 15 frames shown]
[im 1/45]
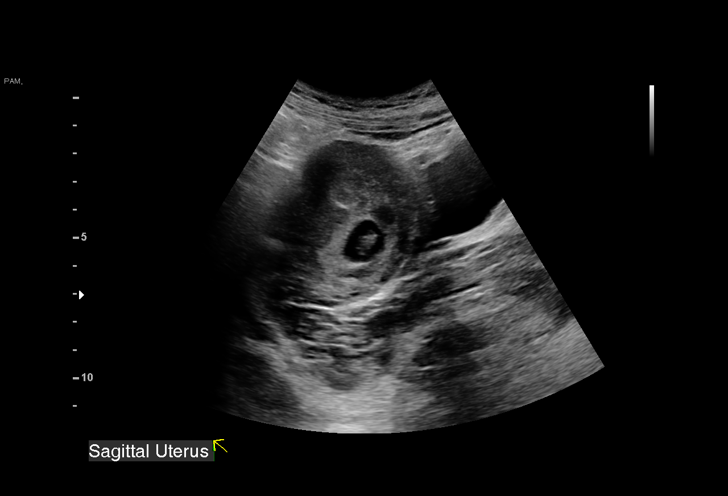
[im 4/45]
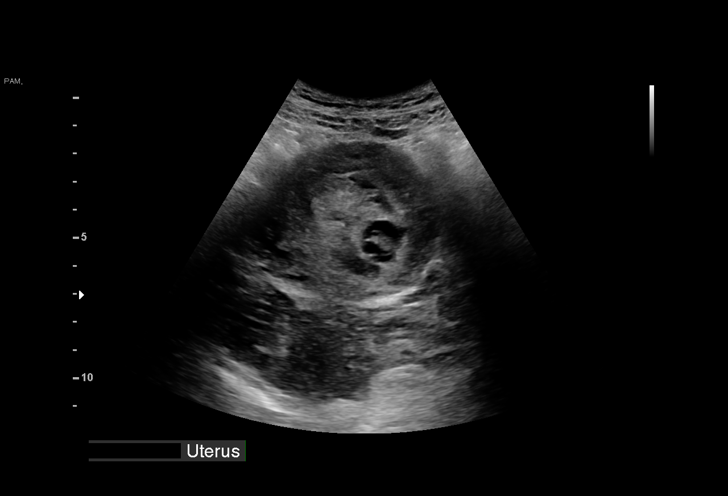
[im 7/45]
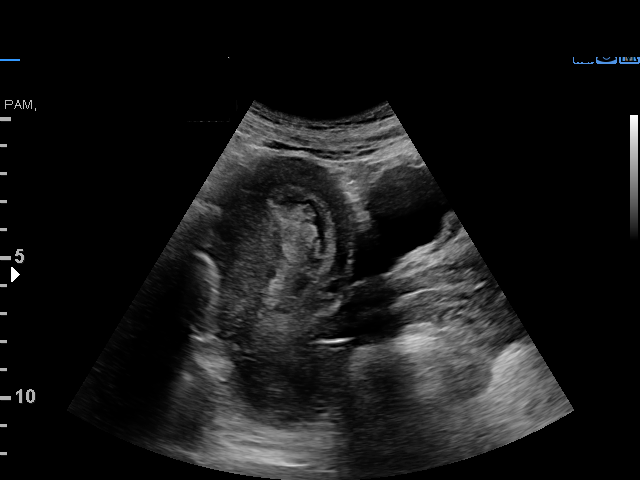
[im 10/45]
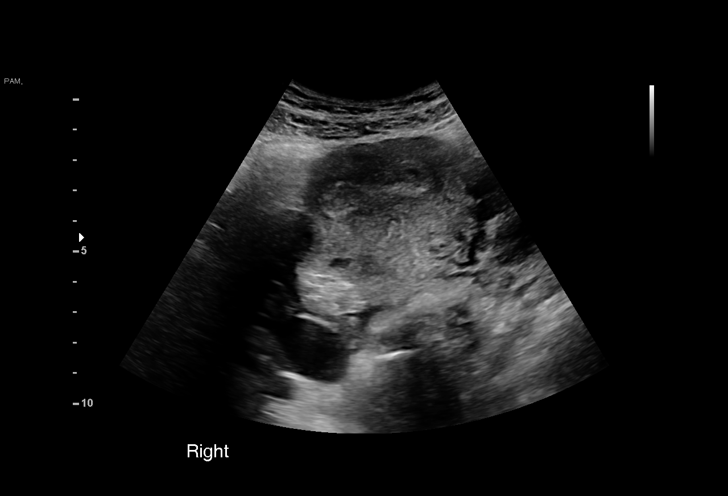
[im 14/45]
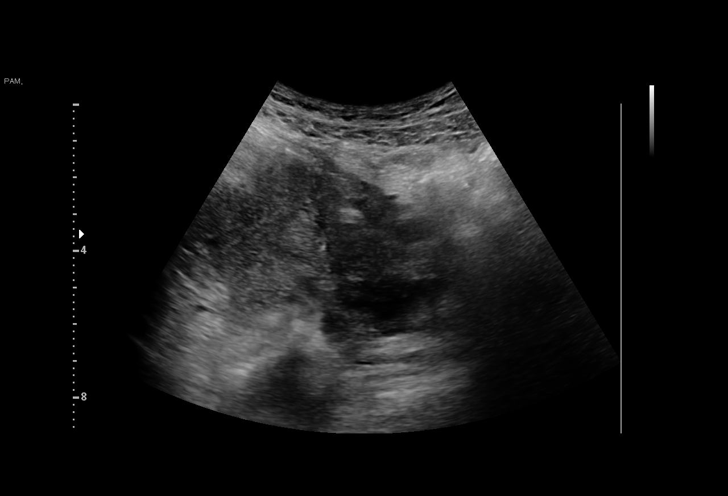
[im 17/45]
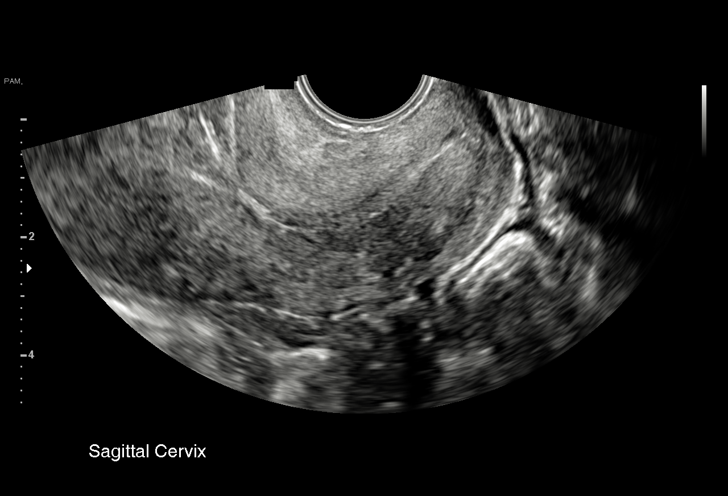
[im 20/45]
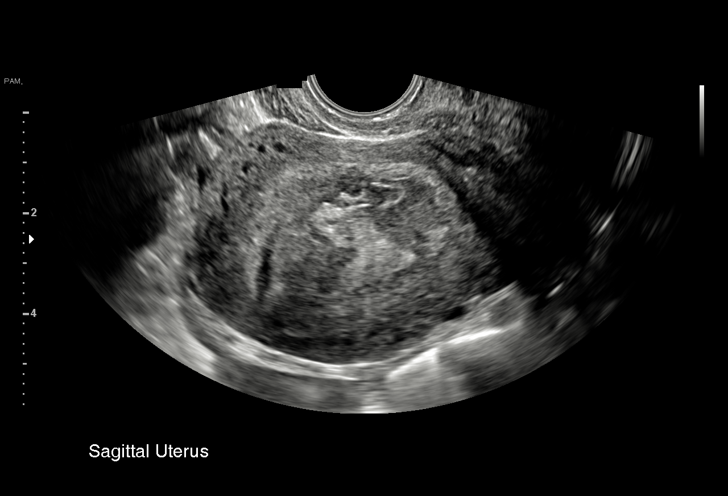
[im 23/45]
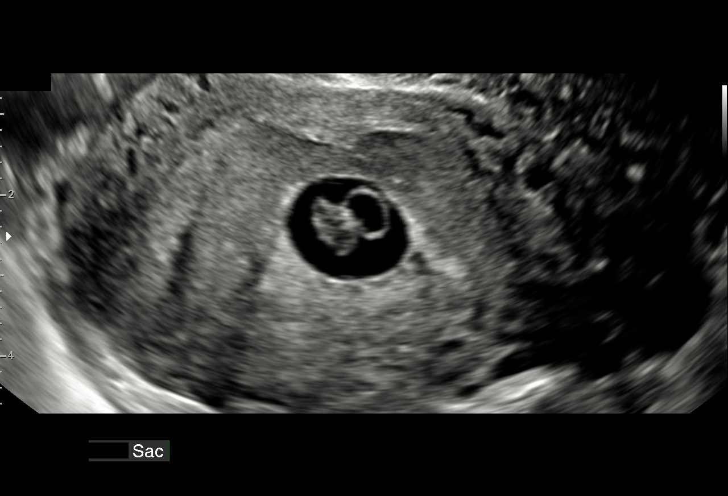
[im 25/45]
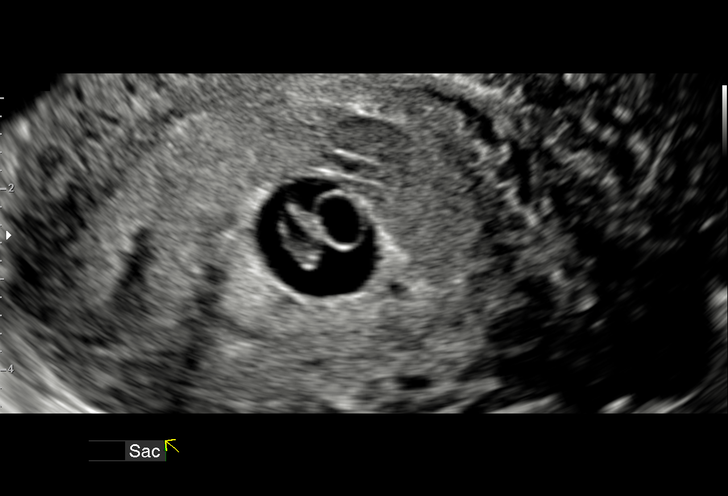
[im 28/45]
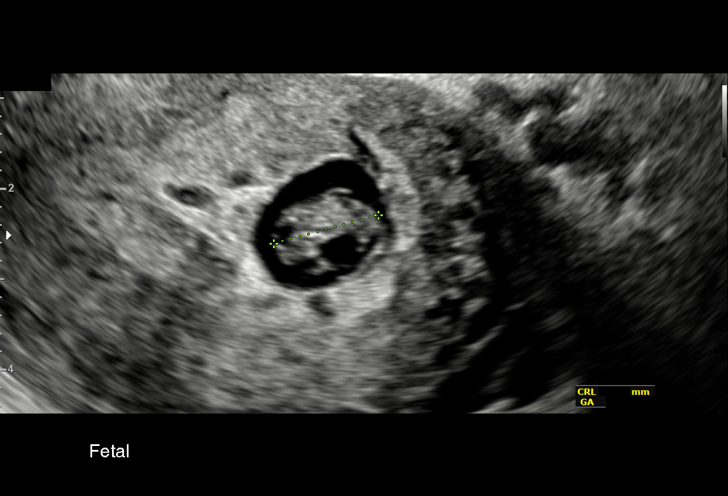
[im 31/45]
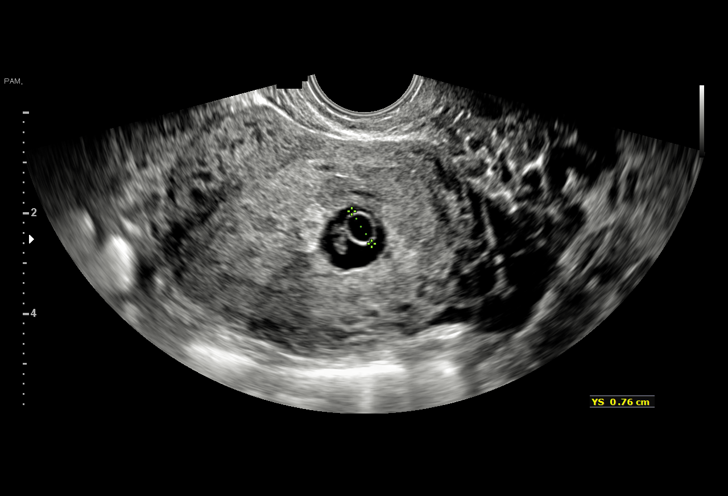
[im 35/45]
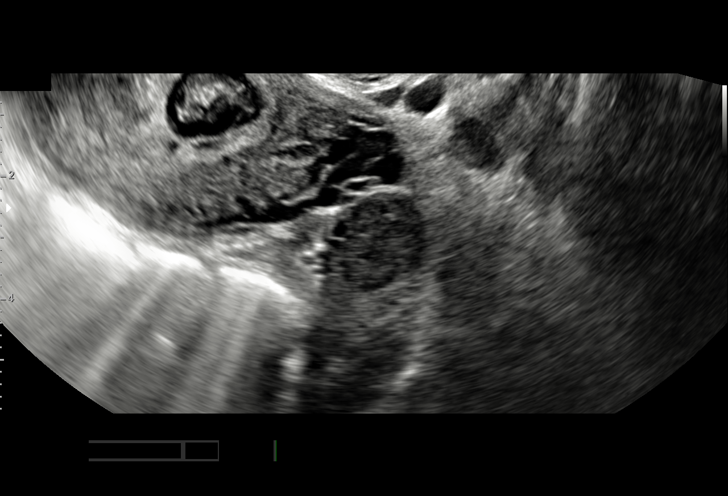
[im 38/45]
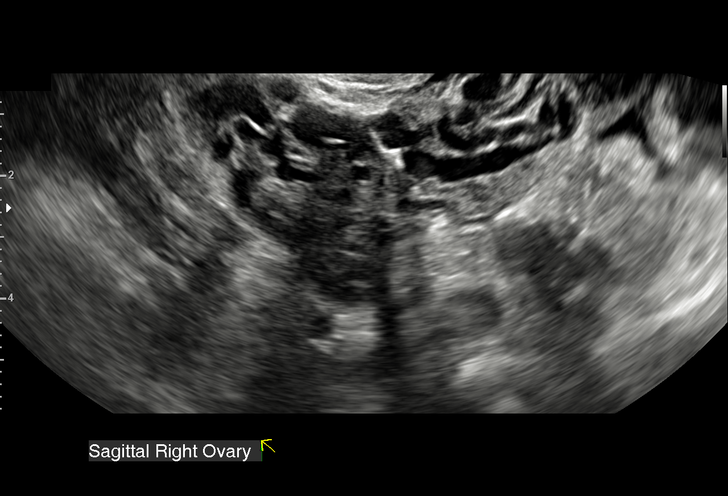
[im 41/45]
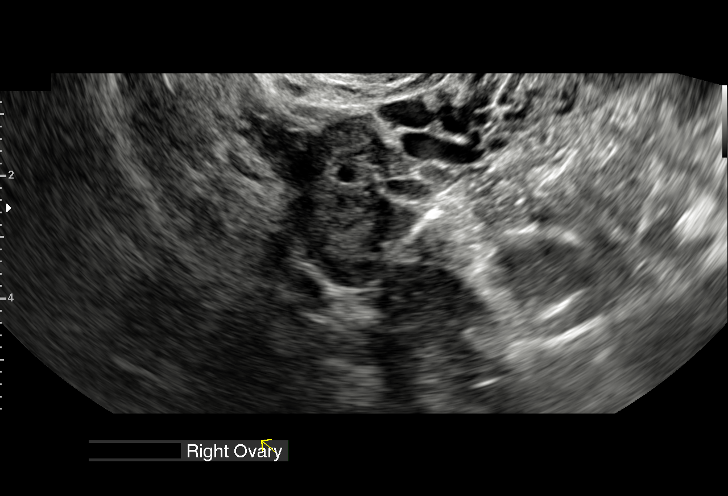
[im 45/45]
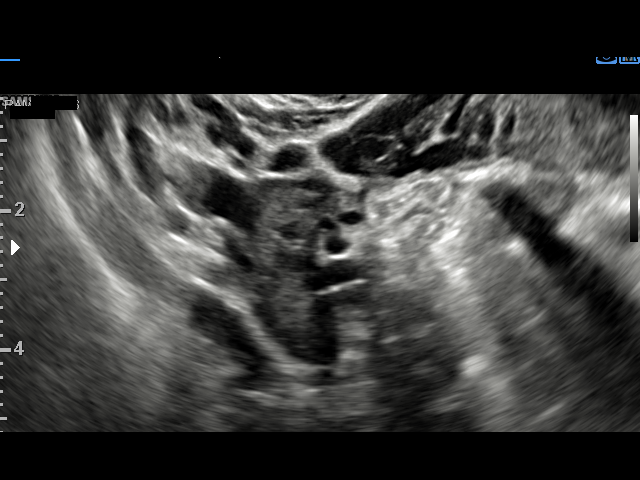

[15 of 28 positions shown; findings below may reference images not displayed]

FINDINGS: Intrauterine gestational sac: Single

Yolk sac:  Visualized.

Embryo:  Visualized.

Cardiac Activity: Visualized.

Heart Rate: 147 bpm

MSD:   mm    w     d

CRL:  11.9 mm   7 w   2 d                  US EDC: December 29, 2019

Subchorionic hemorrhage: There is a large subchorionic hemorrhage
measuring 3.6 x 2.2 cm.

Maternal uterus/adnexae: The ovaries are normal in appearance. There
is an adnexal cyst measuring up to 2.4 cm. Of note, the yolk sac
measures 7.1 x 6.5 mm.
IMPRESSION: 1. A single live IUP is identified.
2. The yolk sac measures 7.1 mm. Given a yolk sac measuring greater
than 7 mm, the pregnancy is suspicious for non viability. Findings
are suspicious but not yet definitive for failed pregnancy.
Recommend follow-up US in 10-14 days for definitive diagnosis. This
recommendation follows SRU consensus guidelines: Diagnostic Criteria
for Nonviable Pregnancy Early in the First Trimester. N Engl J Med
0569; [DATE].
3. Large subchorionic hemorrhage measuring 3.6 x 2.2 cm.
4. There is a 2.4 cm simple paraovarian cyst of doubtful
significance.

## 2019-08-26 DIAGNOSIS — B258 Other cytomegaloviral diseases: Secondary | ICD-10-CM | POA: Diagnosis not present

## 2019-09-03 DIAGNOSIS — R05 Cough: Secondary | ICD-10-CM | POA: Diagnosis not present

## 2019-09-03 DIAGNOSIS — Z20828 Contact with and (suspected) exposure to other viral communicable diseases: Secondary | ICD-10-CM | POA: Diagnosis not present

## 2019-11-26 DIAGNOSIS — Z20828 Contact with and (suspected) exposure to other viral communicable diseases: Secondary | ICD-10-CM | POA: Diagnosis not present

## 2019-11-26 DIAGNOSIS — J029 Acute pharyngitis, unspecified: Secondary | ICD-10-CM | POA: Diagnosis not present

## 2019-11-26 DIAGNOSIS — R070 Pain in throat: Secondary | ICD-10-CM | POA: Diagnosis not present

## 2019-12-13 ENCOUNTER — Telehealth: Payer: Self-pay | Admitting: Family

## 2019-12-13 DIAGNOSIS — H1011 Acute atopic conjunctivitis, right eye: Secondary | ICD-10-CM

## 2019-12-13 NOTE — Progress Notes (Signed)
E-Visit for [employer]   We are sorry that you are not feeling well.  Here is how we plan to help!  Based on what you have shared with me it looks like you have conjunctivitis.  Conjunctivitis is a common inflammatory or infectious condition of the eye that is often referred to as "pink eye".  In most cases it is contagious (viral or bacterial). However, not all conjunctivitis requires antibiotics (ex. Allergic).  We have made appropriate suggestions for you based upon your presentation.  I recommend that you use OpconA, 1-2 drops every 4-6 hours (an over the counter allergy drop available at your local pharmacy).  Your pharmacist may have an alternative suggestion.  Pink eye can be highly contagious.  It is typically spread through direct contact with secretions, or contaminated objects or surfaces that one may have touched.  Strict handwashing is suggested with soap and water is urged.  If not available, use alcohol based had sanitizer.  Avoid unnecessary touching of the eye.  If you wear contact lenses, you will need to refrain from wearing them until you see no white discharge from the eye for at least 24 hours after being on medication.  You should see symptom improvement in 1-2 days after starting the medication regimen.  Call us if symptoms are not improved in 1-2 days.  Home Care:  Wash your hands often!  Do not wear your contacts until you complete your treatment plan.  Avoid sharing towels, bed linen, personal items with a person who has pink eye.  See attention for anyone in your home with similar symptoms.  Get Help Right Away If:  Your symptoms do not improve.  You develop blurred or loss of vision.  Your symptoms worsen (increased discharge, pain or redness)  Your e-visit answers were reviewed by a board certified advanced clinical practitioner to complete your personal care plan.  Depending on the condition, your plan could have included both over the counter or prescription  medications.  If there is a problem please reply  once you have received a response from your provider.  Your safety is important to [place].  If you have drug allergies check your prescription carefully.    You can use MyChart to ask questions about today's visit, request a non-urgent call back, or ask for a work or school excuse for 24 hours related to this e-Visit. If it has been greater than 24 hours you will need to follow up with your provider, or enter a new e-Visit to address those concerns.   You will get an e-mail in the next two days asking about your experience.  I hope that your e-visit has been valuable and will speed your recovery. Thank you for using e-visits.    Greater than 5 minutes, yet less than 10 minutes of time have been spent researching, coordinating, and implementing care for this patient today.  Thank you for the details you included in the comment boxes. Those details are very helpful in determining the best course of treatment for you and help [place] to provide the best care.

## 2019-12-14 DIAGNOSIS — Z7189 Other specified counseling: Secondary | ICD-10-CM | POA: Diagnosis not present

## 2019-12-14 DIAGNOSIS — J3489 Other specified disorders of nose and nasal sinuses: Secondary | ICD-10-CM | POA: Diagnosis not present

## 2019-12-14 DIAGNOSIS — Z20828 Contact with and (suspected) exposure to other viral communicable diseases: Secondary | ICD-10-CM | POA: Diagnosis not present

## 2020-05-03 ENCOUNTER — Telehealth: Payer: Self-pay | Admitting: Family

## 2020-05-03 DIAGNOSIS — B373 Candidiasis of vulva and vagina: Secondary | ICD-10-CM

## 2020-05-03 DIAGNOSIS — B3731 Acute candidiasis of vulva and vagina: Secondary | ICD-10-CM

## 2020-05-03 MED ORDER — FLUCONAZOLE 150 MG PO TABS: 150.0000 mg | ORAL_TABLET | ORAL | 0 refills | Status: DC | PRN

## 2020-05-03 NOTE — Progress Notes (Signed)

## 2020-05-24 ENCOUNTER — Emergency Department (HOSPITAL_COMMUNITY)
Admission: EM | Admit: 2020-05-24 | Discharge: 2020-05-24 | Disposition: A | Discharge status: Home / Self Care | Payer: BC Managed Care – PPO | Attending: Emergency Medicine | Admitting: Emergency Medicine

## 2020-05-24 ENCOUNTER — Emergency Department (HOSPITAL_COMMUNITY): Payer: BC Managed Care – PPO

## 2020-05-24 ENCOUNTER — Other Ambulatory Visit: Payer: Self-pay

## 2020-05-24 DIAGNOSIS — R457 State of emotional shock and stress, unspecified: Secondary | ICD-10-CM | POA: Diagnosis not present

## 2020-05-24 DIAGNOSIS — R070 Pain in throat: Secondary | ICD-10-CM | POA: Diagnosis not present

## 2020-05-24 DIAGNOSIS — R519 Headache, unspecified: Secondary | ICD-10-CM | POA: Diagnosis not present

## 2020-05-24 DIAGNOSIS — E876 Hypokalemia: Secondary | ICD-10-CM | POA: Insufficient documentation

## 2020-05-24 DIAGNOSIS — R0602 Shortness of breath: Secondary | ICD-10-CM | POA: Diagnosis not present

## 2020-05-24 DIAGNOSIS — U071 COVID-19: Secondary | ICD-10-CM | POA: Insufficient documentation

## 2020-05-24 DIAGNOSIS — R202 Paresthesia of skin: Secondary | ICD-10-CM | POA: Diagnosis not present

## 2020-05-24 DIAGNOSIS — J029 Acute pharyngitis, unspecified: Secondary | ICD-10-CM | POA: Diagnosis not present

## 2020-05-24 LAB — BASIC METABOLIC PANEL
Anion gap: 13 (ref 5–15)
BUN: 5 mg/dL — ABNORMAL LOW (ref 6–20)
CO2: 21 mmol/L — ABNORMAL LOW (ref 22–32)
Calcium: 9.5 mg/dL (ref 8.9–10.3)
Chloride: 103 mmol/L (ref 98–111)
Creatinine, Ser: 0.75 mg/dL (ref 0.44–1.00)
GFR, Estimated: >60 mL/min (ref >60)
Glucose, Bld: 105 mg/dL — ABNORMAL HIGH (ref 70–99)
Potassium: 2.9 mmol/L — ABNORMAL LOW (ref 3.5–5.1)
Sodium: 137 mmol/L (ref 135–145)

## 2020-05-24 LAB — CBC WITH DIFFERENTIAL/PLATELET
Abs Immature Granulocytes: 0.02 10*3/uL (ref 0.00–0.07)
Basophils Absolute: 0 10*3/uL (ref 0.0–0.1)
Basophils Relative: 0 %
Eosinophils Absolute: 0 10*3/uL (ref 0.0–0.5)
Eosinophils Relative: 0 %
HCT: 37.7 % (ref 36.0–46.0)
Hemoglobin: 12.3 g/dL (ref 12.0–15.0)
Immature Granulocytes: 0 %
Lymphocytes Relative: 13 %
Lymphs Abs: 1 10*3/uL (ref 0.7–4.0)
MCH: 25.8 pg — ABNORMAL LOW (ref 26.0–34.0)
MCHC: 32.6 g/dL (ref 30.0–36.0)
MCV: 79.2 fL — ABNORMAL LOW (ref 80.0–100.0)
Monocytes Absolute: 0.5 10*3/uL (ref 0.1–1.0)
Monocytes Relative: 8 %
Neutro Abs: 5.7 10*3/uL (ref 1.7–7.7)
Neutrophils Relative %: 79 %
Platelets: 242 10*3/uL (ref 150–400)
RBC: 4.76 MIL/uL (ref 3.87–5.11)
RDW: 13.2 % (ref 11.5–15.5)
WBC: 7.2 10*3/uL (ref 4.0–10.5)
nRBC: 0 % (ref 0.0–0.2)

## 2020-05-24 LAB — RESP PANEL BY RT-PCR (FLU A&B, COVID) ARPGX2
Influenza A by PCR: NEGATIVE
Influenza B by PCR: NEGATIVE
SARS Coronavirus 2 by RT PCR: POSITIVE — AB

## 2020-05-24 LAB — HCG, QUANTITATIVE, PREGNANCY: hCG, Beta Chain, Quant, S: 1 m[IU]/mL (ref <5)

## 2020-05-24 MED ORDER — POTASSIUM CHLORIDE CRYS ER 20 MEQ PO TBCR
40.0000 meq | EXTENDED_RELEASE_TABLET | Freq: Once | ORAL | Status: AC
  Administered 2020-05-24: 40 meq via ORAL
  Filled 2020-05-24: qty 2

## 2020-05-24 MED ORDER — NIRMATRELVIR/RITONAVIR (PAXLOVID)TABLET: 3.0000 | ORAL_TABLET | Freq: Two times a day (BID) | ORAL | 0 refills | Status: AC

## 2020-05-24 NOTE — ED Triage Notes (Signed)
Pt reports started having sore throat, headache on Thursday. Has still been having sore throat, headache, fever, sob, nasal congestion. Took a covid at [employer] on Saturday and it was positive. SOB has worsened since yesterday.

## 2020-05-24 NOTE — ED Provider Notes (Signed)
[facility] EMERGENCY DEPARTMENT Provider Note   CSN: [id number] Arrival date & time: 05/24/20  0305     History Chief Complaint  Patient presents with  . Shortness of Breath  . Sore Throat    Danielle [name] is a 25 y.o. female.  The history is provided by the patient.  Sore Throat This is a new problem. The current episode started 2 days ago. The problem occurs daily. The problem has been gradually worsening. Associated symptoms include shortness of breath. The symptoms are aggravated by swallowing. Nothing relieves the symptoms.   Patient with history of anxiety presents with COVID-19.  She reports approximately 2 days ago she began having sore throat congestion and thought she had allergies.  She did develop fever, shortness of breath and headache.  She had a positive COVID test yesterday.  Patient is concerned that she has a very medically fragile child who has undergone cardiac transplant She is requesting evaluation for oral antiviral medications.  She is fully vaccinated for COVID-19  She reports mild shortness of breath, but no chest pain.  No vomiting.    Past Medical History:  Diagnosis Date  . Anxiety   . Medical history non-contributory     Patient Active Problem List   Diagnosis Date Noted  . Healthcare maintenance 01/04/2018  . Anxiety 01/04/2018  . Normal labor 03/31/2016  . SVD (spontaneous vaginal delivery) 03/31/2016    Past Surgical History:  Procedure Laterality Date  . NO PAST SURGERIES       OB History    Gravida  2   Para  1   Term  1   Preterm      AB      Living  1     SAB      IAB      Ectopic      Multiple  0   Live Births  1           Family History  Problem Relation Age of Onset  . Skin cancer Mother   . Depression Father     Social History   Tobacco Use  . Smoking status: Never Smoker  . Smokeless tobacco: Never Used  Substance Use Topics  . Alcohol use: Not Currently     Alcohol/week: 1.0 standard drink    Types: 1 Glasses of wine per week  . Drug use: No    Home Medications Prior to Admission medications   Medication Sig Start Date End Date Taking? Authorizing Provider  nirmatrelvir/ritonavir EUA (PAXLOVID) TABS Take 3 tablets by mouth 2 (two) times daily for 5 days. Patient GFR is 60. Take nirmatrelvir (150 mg) 2 tablet(s) twice daily for 5 days and ritonavir (100 mg) one tablet twice daily for 5 days. 05/24/20 05/29/20 Yes Zadie Rhine, MD  ALPRAZolam Prudy Feeler) 0.25 MG tablet Take 1 tablet (0.25 mg total) by mouth 2 (two) times daily as needed for anxiety. Patient not taking: Reported on 10/15/2018 01/04/18   William Hamburger D, NP  fluconazole (DIFLUCAN) 150 MG tablet Take 1 tablet (150 mg total) by mouth every three (3) days as needed. 05/03/20   Jannifer Rodney A, FNP  ipratropium (ATROVENT) 0.03 % nasal spray Place 2 sprays into both nostrils every 12 (twelve) hours. 07/09/19   Roxy Horseman, PA-C  Prenatal Vit w/Fe-Methylfol-FA (PNV PO) Take by mouth.    [provider]  sodium chloride (OCEAN) 0.65 % SOLN nasal spray Place 1 spray into both nostrils as needed for congestion.  07/09/19   Roxy Horseman, PA-C    Allergies    Patient has no known allergies.  Review of Systems   Review of Systems  Constitutional: Positive for chills and fever.  Respiratory: Positive for cough and shortness of breath.   Gastrointestinal: Negative for vomiting.  All other systems reviewed and are negative.   Physical Exam Updated Vital Signs BP 105/75   Pulse 84   Temp 99.2 F (37.3 C) (Oral)   Resp 19   Ht 1.651 m (5\' 5" )   Wt 63.5 kg   LMP 05/20/2020   SpO2 99%   BMI 23.30 kg/m   Physical Exam CONSTITUTIONAL: Well developed/well nourished HEAD: Normocephalic/atraumatic EYES: EOMI/PERRL ENMT: Mask in place NECK: supple no meningeal signs SPINE/BACK:entire spine nontender CV: S1/S2 noted, no murmurs/rubs/gallops noted LUNGS: Lungs are clear  to auscultation bilaterally, no apparent distress ABDOMEN: soft NEURO: Pt is awake/alert/appropriate, moves all extremitiesx4.  No facial droop.   EXTREMITIES: pulses normal/equal, full ROM SKIN: warm, color normal PSYCH: no abnormalities of mood noted, alert and oriented to situation  ED Results / Procedures / Treatments   Labs (all labs ordered are listed, but only abnormal results are displayed) Labs Reviewed  RESP PANEL BY RT-PCR (FLU A&B, COVID) ARPGX2 - Abnormal; Notable for the following components:      Result Value   SARS Coronavirus 2 by RT PCR POSITIVE (*)    All other components within normal limits  CBC WITH DIFFERENTIAL/PLATELET - Abnormal; Notable for the following components:   MCV 79.2 (*)    MCH 25.8 (*)    All other components within normal limits  BASIC METABOLIC PANEL - Abnormal; Notable for the following components:   Potassium 2.9 (*)    CO2 21 (*)    Glucose, Bld 105 (*)    BUN 5 (*)    All other components within normal limits  HCG, QUANTITATIVE, PREGNANCY    EKG None  Radiology DG Chest Port 1 View  Result Date: 05/24/2020 CLINICAL DATA:  Shortness of breath with sore throat and headache, COVID positive EXAM: PORTABLE CHEST 1 VIEW COMPARISON:  None. FINDINGS: The heart size and mediastinal contours are within normal limits. Perihilar predominant interstitial thickening with peribronchial cuffing. No focal consolidation. No visible pleural effusion or pneumothorax. The visualized skeletal structures are unremarkable. IMPRESSION: Perihilar predominant interstitial thickening with peribronchial cuffing, findings consistent with atypical/viral infection. Electronically Signed   By: 05/26/2020 MD   On: 05/24/2020 03:42    Procedures Procedures   Medications Ordered in ED Medications  potassium chloride SA (KLOR-CON) CR tablet 40 mEq (40 mEq Oral Given 05/24/20 0459)    ED Course  I have reviewed the triage vital signs and the nursing  notes.  Pertinent labs & imaging results that were available during my care of the patient were reviewed by me and considered in my medical decision making (see chart for details).    MDM Rules/Calculators/A&P                          Patient presents with COVID-19. Overall she is in no distress.  No hypoxia.  She does have mild hypokalemia She is safe for discharge home.  After discussing the risk and benefits, will place patient on Paxlovid.  She is concerned because she has a medically fragile child at home and wants to ensure that she does not have severe illness.  This seems reasonable, this has been prescribed for patient.  We discussed typical side effects. Also advised to monitor her pulse ox at home Advised patient to increase fluids, and electrolyte drinks for her hypokalemia Final Clinical Impression(s) / ED Diagnoses Final diagnoses:  COVID-19  Hypokalemia    Rx / DC Orders ED Discharge Orders         Ordered    nirmatrelvir/ritonavir EUA (PAXLOVID) TABS  2 times daily        05/24/20 0533           Zadie Rhine, MD 05/24/20 [id number]

## 2020-05-24 NOTE — ED Provider Notes (Signed)
MSE was initiated and I personally evaluated the patient and placed orders (if any) at  3:13 AM on May 24, 2020.  Patient here with SOB.  Had home covid test that was positive yesterday.  Reports increased SOB.  Also feels very anxious.  Daughter at home is heart transplant patient and immunosuppressed, so patient wanted to be extra careful with her diagnosis of COVID  ROS: As listed above  PE: Alert and oriented Answers questions appropriately No respiratory distress Seem anxious  Discussed with patient that their care has been initiated.   They are counseled that they will need to remain in the ED until the completion of their workup, including full H&P and results of any tests.  Risks of leaving the emergency department prior to completion of treatment were discussed. Patient was advised to inform ED staff if they are leaving before their treatment is complete. The patient acknowledged these risks and time was allowed for questions.    The patient appears stable so that the remainder of the MSE may be completed by another provider.    Roxy Horseman, PA-C 05/24/20 0315    Zadie Rhine, MD 05/24/20 0500

## 2020-05-24 NOTE — ED Notes (Signed)
Pt is in restroom at this time

## 2020-05-24 NOTE — ED Notes (Signed)
Patient verbalizes understanding of discharge instructions. Opportunity for questioning and answers were provided. Armband removed by staff, pt discharged from ED and ambulated to lobby to return home with family.  

## 2020-06-05 ENCOUNTER — Telehealth (INDEPENDENT_AMBULATORY_CARE_PROVIDER_SITE_OTHER): Payer: BC Managed Care – PPO | Admitting: Internal Medicine

## 2020-06-05 ENCOUNTER — Encounter: Payer: Self-pay | Admitting: Internal Medicine

## 2020-06-05 DIAGNOSIS — E162 Hypoglycemia, unspecified: Secondary | ICD-10-CM

## 2020-06-05 DIAGNOSIS — F411 Generalized anxiety disorder: Secondary | ICD-10-CM | POA: Diagnosis not present

## 2020-06-05 MED ORDER — SERTRALINE HCL 25 MG PO TABS: 25.0000 mg | ORAL_TABLET | Freq: Every day | ORAL | 1 refills | Status: DC

## 2020-06-05 NOTE — Progress Notes (Signed)
Virtual Visit via Video Note  I connected with Danielle [name] on 06/05/20 at  2:30 PM EDT by a video enabled telemedicine application and verified that I am speaking with the correct person using two identifiers.  Location patient: home Location provider: work office Persons participating in the virtual visit: patient, provider  I discussed the limitations of evaluation and management by telemedicine and the availability of in person appointments. The patient expressed understanding and agreed to proceed.   HPI: She has scheduled this visit to establish care and discuss chronic conditions.  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  Her daughter is 38-1/2 years old.  She is married.  She does not smoke, she drinks alcohol only occasionally.  She has no known drug allergies, she has no significant past surgical history.  She has a history of generalized anxiety disorder and had been prescribed low-dose Xanax in the past which she felt helped.  She is interested in resuming antianxiety medication.  She was diagnosed with COVID-19 on May 15 and was prescribed Paxlovid.  She is recovering well.  Throughout her life she has had occasional episodes of hypoglycemia.  These happen maybe once or twice a month.  Most recently it happened an hour after dinner she felt dizzy and lightheaded when she checked her sugar it was 40.  This has never been worked up before.   ROS: Constitutional: Denies fever, chills, diaphoresis, appetite change and fatigue.  HEENT: Denies photophobia, eye pain, redness, hearing loss, ear pain, congestion, sore throat, rhinorrhea, sneezing, mouth sores, trouble swallowing, neck pain, neck stiffness and tinnitus.   Respiratory: Denies SOB, DOE, cough, chest tightness,  and wheezing.   Cardiovascular: Denies chest pain,  palpitations and leg swelling.  Gastrointestinal: Denies nausea, vomiting, abdominal pain, diarrhea, constipation, blood in stool and abdominal distention.  Genitourinary: Denies dysuria, urgency, frequency, hematuria, flank pain and difficulty urinating.  Endocrine: Denies: hot or cold intolerance, sweats, changes in hair or nails, polyuria, polydipsia. Musculoskeletal: Denies myalgias, back pain, joint swelling, arthralgias and gait problem.  Skin: Denies pallor, rash and wound.  Neurological: Denies dizziness, seizures, syncope, weakness, light-headedness, numbness and headaches.  Hematological: Denies adenopathy. Easy bruising, personal or family bleeding history  Psychiatric/Behavioral: Denies suicidal ideation, mood changes, confusion, nervousness, sleep disturbance and agitation   Past Medical History:  Diagnosis Date  . Anxiety   . GAD (generalized anxiety disorder)   . Hypoglycemia   . Medical history non-contributory     Past Surgical History:  Procedure Laterality Date  . NO PAST SURGERIES      Family History  Problem Relation Age of Onset  . Skin cancer Mother   . Depression Father     SOCIAL HX:   reports that she has never smoked. She has never used smokeless tobacco. She reports current alcohol use of about 1.0 standard drink of alcohol per week. She reports that she does not use drugs.   Current Outpatient Medications:  .  sertraline (ZOLOFT) 25 MG tablet, Take 1 tablet (25 mg total) by mouth daily., Disp: 90 tablet, Rfl: 1 .  ALPRAZolam (XANAX) 0.25 MG tablet, Take 1 tablet (0.25 mg total) by mouth 2 (two) times daily as needed for anxiety. (Patient not taking: Reported on 06/05/2020), Disp: 20 tablet, Rfl: 0 .  fluconazole (DIFLUCAN) 150 MG tablet, Take 1 tablet (  150 mg total) by mouth every three (3) days as needed. (Patient not taking: Reported on 06/05/2020), Disp: 2 tablet, Rfl: 0 .  ipratropium (ATROVENT) 0.03 % nasal spray, Place 2 sprays into both nostrils  every 12 (twelve) hours. (Patient not taking: Reported on 06/05/2020), Disp: 30 mL, Rfl: 0 .  Prenatal Vit w/Fe-Methylfol-FA (PNV PO), Take by mouth. (Patient not taking: Reported on 06/05/2020), Disp: , Rfl:  .  sodium chloride (OCEAN) 0.65 % SOLN nasal spray, Place 1 spray into both nostrils as needed for congestion. (Patient not taking: Reported on 06/05/2020), Disp: 60 mL, Rfl: 0  EXAM:   VITALS per patient if applicable: None reported  GENERAL: alert, oriented, appears well and in no acute distress  HEENT: atraumatic, conjunttiva clear, no obvious abnormalities on inspection of external nose and ears  NECK: normal movements of the head and neck  LUNGS: on inspection no signs of respiratory distress, breathing rate appears normal, no obvious gross increased work of breathing, gasping or wheezing  CV: no obvious cyanosis  MS: moves all visible extremities without noticeable abnormality  PSYCH/NEURO: pleasant and cooperative, no obvious depression or anxiety, speech and thought processing grossly intact  ASSESSMENT AND PLAN:   GAD (generalized anxiety disorder)  -I think it is reasonable to start sertraline low-dose 25 mg at bedtime. -She will schedule a visit in 8 weeks for follow-up.  Hypoglycemia -She will pick up a sample of freestyle libre to track her sugars over the next 14 days. -Further work-up to follow.     I discussed the assessment and treatment plan with the patient. The patient was provided an opportunity to ask questions and all were answered. The patient agreed with the plan and demonstrated an understanding of the instructions.   The patient was advised to call back or seek an in-person evaluation if the symptoms worsen or if the condition fails to improve as anticipated.    Chaya Jan, MD  [facility]

## 2020-06-14 ENCOUNTER — Encounter: Payer: Self-pay | Admitting: Internal Medicine

## 2020-06-14 DIAGNOSIS — E162 Hypoglycemia, unspecified: Secondary | ICD-10-CM

## 2020-06-16 NOTE — Telephone Encounter (Signed)
Referral to Endocrinology has been placed.

## 2020-06-24 ENCOUNTER — Encounter: Payer: Self-pay | Admitting: Internal Medicine

## 2020-06-25 NOTE — Telephone Encounter (Signed)
Patient was given a sample pack today from Dr Dr Ardyth Harps.

## 2020-08-05 DIAGNOSIS — H10021 Other mucopurulent conjunctivitis, right eye: Secondary | ICD-10-CM | POA: Diagnosis not present

## 2020-09-17 ENCOUNTER — Telehealth: Payer: BC Managed Care – PPO | Admitting: Physician Assistant

## 2020-09-17 ENCOUNTER — Other Ambulatory Visit: Payer: Self-pay

## 2020-09-17 ENCOUNTER — Encounter: Payer: Self-pay | Admitting: Endocrinology

## 2020-09-17 ENCOUNTER — Ambulatory Visit (INDEPENDENT_AMBULATORY_CARE_PROVIDER_SITE_OTHER): Payer: BC Managed Care – PPO | Admitting: Endocrinology

## 2020-09-17 VITALS — BP 100/60 | HR 84 | Ht 65.0 in | Wt 139.0 lb

## 2020-09-17 DIAGNOSIS — J019 Acute sinusitis, unspecified: Secondary | ICD-10-CM

## 2020-09-17 DIAGNOSIS — B9689 Other specified bacterial agents as the cause of diseases classified elsewhere: Secondary | ICD-10-CM

## 2020-09-17 DIAGNOSIS — E162 Hypoglycemia, unspecified: Secondary | ICD-10-CM

## 2020-09-17 LAB — HEPATIC FUNCTION PANEL
ALT: 8 U/L (ref 0–35)
AST: 12 U/L (ref 0–37)
Albumin: 4.5 g/dL (ref 3.5–5.2)
Alkaline Phosphatase: 57 U/L (ref 39–117)
Bilirubin, Direct: 0.1 mg/dL (ref 0.0–0.3)
Total Bilirubin: 0.4 mg/dL (ref 0.2–1.2)
Total Protein: 7.6 g/dL (ref 6.0–8.3)

## 2020-09-17 LAB — TSH: TSH: 1.09 u[IU]/mL (ref 0.35–5.50)

## 2020-09-17 LAB — BASIC METABOLIC PANEL
BUN: 8 mg/dL (ref 6–23)
CO2: 28 mEq/L (ref 19–32)
Calcium: 9.5 mg/dL (ref 8.4–10.5)
Chloride: 104 mEq/L (ref 96–112)
Creatinine, Ser: 0.65 mg/dL (ref 0.40–1.20)
GFR: 121.07 mL/min (ref >60.00)
Glucose, Bld: 59 mg/dL — ABNORMAL LOW (ref 70–99)
Potassium: 4.3 mEq/L (ref 3.5–5.1)
Sodium: 140 mEq/L (ref 135–145)

## 2020-09-17 LAB — CORTISOL: Cortisol, Plasma: 8.6 ug/dL

## 2020-09-17 LAB — HEMOGLOBIN A1C: Hgb A1c MFr Bld: 5.5 % (ref 4.6–6.5)

## 2020-09-17 LAB — T4, FREE: Free T4: 0.89 ng/dL (ref 0.60–1.60)

## 2020-09-17 MED ORDER — AMOXICILLIN-POT CLAVULANATE 875-125 MG PO TABS: 1.0000 | ORAL_TABLET | Freq: Two times a day (BID) | ORAL | 0 refills | Status: DC

## 2020-09-17 NOTE — Progress Notes (Signed)
Subjective:    Patient ID: Danielle [name], female    DOB: 03-29-1995, 25 y.o.   MRN: [id number]  HPI .Pt is referred by Dr HA, for hypoglycemia.  Pt reports 10 years of intermittent tremor, lightheadedness, and sweating.  This happens approx 20 mins hrs after eating, but can also happen in the middle of the night. She has found to have glucose of 40-50, measured on continuous glucose monitor.  Symptoms are relieved by eating or drinking.  She takes no medication for the blood sugar. She did not have GDM.  She has never had weight loss surgery, alcohol intake, adrenal problems, pituitary problems, liver problems, or kidney problems.  She has never had pancreatic imaging.  She has reg menses.  She last ate approx 3 hrs ago.   Past Medical History:  Diagnosis Date   Anxiety    GAD (generalized anxiety disorder)    Hypoglycemia    Medical history non-contributory     Past Surgical History:  Procedure Laterality Date   NO PAST SURGERIES      Social History   Socioeconomic History   Marital status: Married    Spouse name: Not on file   Number of children: Not on file   Years of education: Not on file   Highest education level: Not on file  Occupational History   Not on file  Tobacco Use   Smoking status: Never   Smokeless tobacco: Never  Substance and Sexual Activity   Alcohol use: Yes    Alcohol/week: 1.0 standard drink    Types: 1 Glasses of wine per week    Comment: occasional   Drug use: No   Sexual activity: Yes    Birth control/protection: None    Comment: last intercourse yesterday   Other Topics Concern   Not on file  Social History Narrative   Updated 05/21/15   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, 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; working as Engineer, civil (consulting) Situation: lives with mother most of the time      Spiritual Beliefs: none      Lifestyle: get regular exercise, diet is healthy         Social  Determinants of Corporate investment banker Strain: Not on file  Food Insecurity: Not on file  Transportation Needs: Not on file  Physical Activity: Not on file  Stress: Not on file  Social Connections: Not on file  Intimate Partner Violence: Not on file    Current Outpatient Medications on File Prior to Visit  Medication Sig Dispense Refill   sertraline (ZOLOFT) 25 MG tablet Take 1 tablet (25 mg total) by mouth daily. 90 tablet 1   No current facility-administered medications on file prior to visit.    No Known Allergies  Family History  Problem Relation Age of Onset   Skin cancer Mother    Depression Father    Other Other     BP 100/60 (BP Location: Right Arm, Patient Position: Sitting, Cuff Size: Normal)   Pulse 84   Ht 5\' 5"  (1.651 m)   Wt 139 lb (63 kg)   SpO2 99%   BMI 23.13 kg/m     Review of Systems denies weight change, diarrhea, rash, galactorrhea, excessive diaphoresis, skin rash, n/v, seizure, or LOC.  She has intermitt HA.      Objective:   Physical Exam VS: see vs page GEN: no  distress HEAD: head: no deformity eyes: no periorbital swelling, no proptosis external nose and ears are normal NECK: supple, thyroid is not enlarged CHEST WALL: no deformity LUNGS: clear to auscultation CV: reg rate and rhythm, no murmur.  MUSCULOSKELETAL: gait is normal and steady EXTEMITIES: no deformity.  no leg edema NEURO:  readily moves all 4's.  sensation is intact to touch on all 4's SKIN:  Normal texture and temperature.  No rash or suspicious lesion is visible.   NODES:  None palpable at the neck PSYCH: alert, well-oriented.  Does not appear anxious nor depressed.    I have reviewed outside records, and summarized: Pt was noted to have hypoglycemia, and referred here.  She was rx'ed for GAD.  She wore continuous glucose monitor, but those results were not added to the EMR.      Assessment & Plan:  Hypoglycemia, new to me.  Uncertain if fasting or reactive.    Patient Instructions  Blood tests are requested for you today.  We'll let you know about the results.  We are placing a continuous glucose monitor today.   Please come back for a follow-up appointment in 2 weeks.  Please come at 8AM, and plan to spend the day here.  You would have only water after dinner the night before.

## 2020-09-17 NOTE — Patient Instructions (Addendum)
Blood tests are requested for you today.  We'll let you know about the results.  We are placing a continuous glucose monitor today.   Please come back for a follow-up appointment in 2 weeks.  Please come at 8AM, and plan to spend the day here.  You would have only water after dinner the night before.

## 2020-09-17 NOTE — Progress Notes (Signed)

## 2020-09-17 NOTE — Progress Notes (Signed)
Freestyle Libre pro has been placed on pt's Lt arm. Pt advised to F/U in 2 weeks  Lot# [id number]  Exp 03/09/2021

## 2020-09-20 LAB — ACTH: C206 ACTH: 13 pg/mL (ref 6–50)

## 2020-10-01 ENCOUNTER — Encounter: Payer: Self-pay | Admitting: Endocrinology

## 2020-10-01 ENCOUNTER — Other Ambulatory Visit (INDEPENDENT_AMBULATORY_CARE_PROVIDER_SITE_OTHER): Payer: BC Managed Care – PPO

## 2020-10-01 ENCOUNTER — Ambulatory Visit (INDEPENDENT_AMBULATORY_CARE_PROVIDER_SITE_OTHER): Payer: BC Managed Care – PPO | Admitting: Endocrinology

## 2020-10-01 VITALS — BP 118/78 | HR 77 | Ht 65.0 in | Wt 141.0 lb

## 2020-10-01 DIAGNOSIS — E162 Hypoglycemia, unspecified: Secondary | ICD-10-CM | POA: Diagnosis not present

## 2020-10-01 LAB — POCT GLUCOSE (DEVICE FOR HOME USE)
Glucose Fasting, POC: 79 mg/dL (ref 70–99)
Glucose Fasting, POC: 78 mg/dL (ref 70–99)
POC Glucose: 86 mg/dl (ref 70–99)
POC Glucose: 87 mg/dl (ref 70–99)

## 2020-10-01 LAB — GLUCOSE, RANDOM: Glucose, Bld: 81 mg/dL (ref 70–99)

## 2020-10-01 NOTE — Progress Notes (Signed)
Subjective:    Patient ID: Danielle [name], female    DOB: 19-May-1995, 25 y.o.   MRN: [id number]  HPI Pt returns for f/u of hypoglycemia.  Pt says glucose was low last week, but she did not have monitor available.  Sxs resolved with a large amount of PO intake.  She last ate at 7PM last night.  I reviewed continuous glucose monitor data.  Glucose varies from 40-140.  It is in general lowest 11PM-5AM.  And highest 3PM-9PM.  Pt is in the office 8AM-3:30PM.  She has intermitt HA.   Past Medical History:  Diagnosis Date   Anxiety    GAD (generalized anxiety disorder)    Hypoglycemia    Medical history non-contributory     Past Surgical History:  Procedure Laterality Date   NO PAST SURGERIES      Social History   Socioeconomic History   Marital status: Married    Spouse name: Not on file   Number of children: Not on file   Years of education: Not on file   Highest education level: Not on file  Occupational History   Not on file  Tobacco Use   Smoking status: Never   Smokeless tobacco: Never  Substance and Sexual Activity   Alcohol use: Yes    Alcohol/week: 1.0 standard drink    Types: 1 Glasses of wine per week    Comment: occasional   Drug use: No   Sexual activity: Yes    Birth control/protection: None    Comment: last intercourse yesterday   Other Topics Concern   Not on file  Social History Narrative   Updated 05/21/15   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, 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; working as Engineer, civil (consulting) Situation: lives with mother most of the time      Spiritual Beliefs: none      Lifestyle: get regular exercise, diet is healthy         Social Determinants of Corporate investment banker Strain: Not on file  Food Insecurity: Not on file  Transportation Needs: Not on file  Physical Activity: Not on file  Stress: Not on file  Social Connections: Not on file  Intimate Partner Violence: Not  on file    Current Outpatient Medications on File Prior to Visit  Medication Sig Dispense Refill   sertraline (ZOLOFT) 25 MG tablet Take 1 tablet (25 mg total) by mouth daily. 90 tablet 1   amoxicillin-clavulanate (AUGMENTIN) 875-125 MG tablet Take 1 tablet by mouth 2 (two) times daily. 14 tablet 0   No current facility-administered medications on file prior to visit.    No Known Allergies  Family History  Problem Relation Age of Onset   Skin cancer Mother    Depression Father    Other Other     BP 118/78 (BP Location: Right Arm, Patient Position: Sitting, Cuff Size: Normal)   Pulse 77   Ht 5\' 5"  (1.651 m)   Wt 141 lb (64 kg)   SpO2 97%   BMI 23.46 kg/m    Review of Systems     Objective:   Physical Exam VITAL SIGNS:  See vs page GENERAL: no distress  Insulin, after 21HR fast=3.     Assessment & Plan:  Hypoglycemia: uncontrolled, but we are unable to reproduce in a 21HR fast.   Patient Instructions  Blood tests are requested for you today.  We'll let you know about the results.

## 2020-10-01 NOTE — Patient Instructions (Signed)
Blood tests are requested for you today.  We'll let you know about the results.  

## 2020-10-02 LAB — C-PEPTIDE: C-Peptide: 0.88 ng/mL (ref 0.80–3.85)

## 2020-10-02 LAB — INSULIN, RANDOM: Insulin: 3.1 u[IU]/mL

## 2020-10-02 MED ORDER — DEXCOM G6 SENSOR MISC: 1.0000 | 3 refills | Status: DC

## 2020-10-02 NOTE — Telephone Encounter (Signed)
Pt calling in 9/23 @ 1204 following up on previous messages sent through MyChart.

## 2020-10-05 ENCOUNTER — Other Ambulatory Visit (HOSPITAL_COMMUNITY): Payer: Self-pay

## 2020-10-05 ENCOUNTER — Telehealth: Payer: Self-pay | Admitting: Pharmacy Technician

## 2020-10-05 NOTE — Telephone Encounter (Signed)
Patient Advocate Encounter   Received notification that prior authorization for Dexcom is required.   PA submitted on 10/05/2020 [id number] Status is pending    [facility] will continue to follow:   Sherilyn Dacosta, CPhT Patient Advocate [facility] Phone: [phone] Fax:  [phone]

## 2020-10-06 NOTE — Telephone Encounter (Signed)
Message sent thru MyChart 

## 2020-10-06 NOTE — Telephone Encounter (Signed)
[facility] Patient Advocate Encounter  Received notification from CoverMyMeds that the request for prior authorization for Dexcom has been cancelled.  No reasoning has been received yet.  Test claim showed that insurance requires patient to be insulin dependant.

## 2020-10-08 ENCOUNTER — Other Ambulatory Visit (HOSPITAL_COMMUNITY): Payer: Self-pay

## 2020-10-14 ENCOUNTER — Encounter: Payer: Self-pay | Admitting: Endocrinology

## 2020-10-14 NOTE — Telephone Encounter (Signed)
Patient called to advise that she would prefer to be referred to [facility] as per message from Dr Everardo All.

## 2020-10-15 ENCOUNTER — Other Ambulatory Visit (HOSPITAL_COMMUNITY): Payer: Self-pay

## 2020-10-15 ENCOUNTER — Other Ambulatory Visit: Payer: Self-pay | Admitting: Endocrinology

## 2020-10-15 DIAGNOSIS — E162 Hypoglycemia, unspecified: Secondary | ICD-10-CM

## 2020-10-15 NOTE — Telephone Encounter (Signed)
[facility] Patient Advocate Encounter  Received notification from [facility] that the request for prior authorization for Dexcom has been denied due to not meeting coverage requirements.  Pt must be using insulin.

## 2020-11-30 ENCOUNTER — Other Ambulatory Visit: Payer: Self-pay | Admitting: Internal Medicine

## 2020-11-30 DIAGNOSIS — F411 Generalized anxiety disorder: Secondary | ICD-10-CM

## 2020-12-01 ENCOUNTER — Telehealth: Payer: BC Managed Care – PPO | Admitting: Physician Assistant

## 2020-12-01 DIAGNOSIS — J111 Influenza due to unidentified influenza virus with other respiratory manifestations: Secondary | ICD-10-CM

## 2020-12-01 MED ORDER — OSELTAMIVIR PHOSPHATE 75 MG PO CAPS: 75.0000 mg | ORAL_CAPSULE | Freq: Two times a day (BID) | ORAL | 0 refills | Status: DC

## 2020-12-01 NOTE — Progress Notes (Signed)
E visit for Flu like symptoms   We are sorry that you are not feeling well.  Here is how we plan to help! Based on what you have shared with me it looks like you may have a respiratory virus that may be influenza.  Influenza or "the flu" is   an infection caused by a respiratory virus. The flu virus is highly contagious and persons who did not receive their yearly flu vaccination may "catch" the flu from close contact.  We have anti-viral medications to treat the viruses that cause this infection. They are not a "cure" and only shorten the course of the infection. These prescriptions are most effective when they are given within the first 2 days of "flu" symptoms. Antiviral medication are indicated if you have a high risk of complications from the flu. You should  also consider an antiviral medication if you are in close contact with someone who is at risk. These medications can help patients avoid complications from the flu  but have side effects that you should know. Possible side effects from Tamiflu or oseltamivir include nausea, vomiting, diarrhea, dizziness, headaches, eye redness, sleep problems or other respiratory symptoms. You should not take Tamiflu if you have an allergy to oseltamivir or any to the ingredients in Tamiflu.  Based upon your symptoms and potential risk factors I have prescribed Oseltamivir (Tamiflu).  It has been sent to your designated pharmacy.  You will take one 75 mg capsule orally twice a day for the next 5 days.  ANYONE WHO HAS FLU SYMPTOMS SHOULD: Stay home. The flu is highly contagious and going out or to work exposes others! Be sure to drink plenty of fluids. Water is fine as well as fruit juices, sodas and electrolyte beverages. You may want to stay away from caffeine or alcohol. If you are nauseated, try taking small sips of liquids. How do you know if you are getting enough fluid? Your urine should be a pale yellow or almost colorless. Get rest. Taking a steamy  shower or using a humidifier may help nasal congestion and ease sore throat pain. Using a saline nasal spray works much the same way. Cough drops, hard candies and sore throat lozenges may ease your cough. Line up a caregiver. Have someone check on you regularly.   GET HELP RIGHT AWAY IF: You cannot keep down liquids or your medications. You become short of breath Your fell like you are going to pass out or loose consciousness. Your symptoms persist after you have completed your treatment plan MAKE SURE YOU  Understand these instructions. Will watch your condition. Will get help right away if you are not doing well or get worse.  Your e-visit answers were reviewed by a board certified advanced clinical practitioner to complete your personal care plan.  Depending on the condition, your plan could have included both over the counter or prescription medications.  If there is a problem please reply  once you have received a response from your provider.  Your safety is important to us.  If you have drug allergies check your prescription carefully.    You can use MyChart to ask questions about today's visit, request a non-urgent call back, or ask for a work or school excuse for 24 hours related to this e-Visit. If it has been greater than 24 hours you will need to follow up with your provider, or enter a new e-Visit to address those concerns.  You will get an e-mail in the next   two days asking about your experience.  I hope that your e-visit has been valuable and will speed your recovery. Thank you for using e-visits.  I provided 5 minutes of non face-to-face time during this encounter for chart review and documentation.   

## 2020-12-08 ENCOUNTER — Encounter: Payer: Self-pay | Admitting: Internal Medicine

## 2020-12-08 ENCOUNTER — Ambulatory Visit (INDEPENDENT_AMBULATORY_CARE_PROVIDER_SITE_OTHER): Payer: BC Managed Care – PPO | Admitting: Internal Medicine

## 2020-12-08 VITALS — BP 102/64 | HR 72 | Temp 97.5°F | Wt 147.8 lb

## 2020-12-08 DIAGNOSIS — F411 Generalized anxiety disorder: Secondary | ICD-10-CM | POA: Diagnosis not present

## 2020-12-08 DIAGNOSIS — E162 Hypoglycemia, unspecified: Secondary | ICD-10-CM | POA: Diagnosis not present

## 2020-12-08 MED ORDER — SERTRALINE HCL 25 MG PO TABS: ORAL_TABLET | ORAL | 1 refills | Status: AC

## 2020-12-08 NOTE — Progress Notes (Signed)
Established Patient Office Visit     This visit occurred during the SARS-CoV-2 public health emergency.  Safety protocols were in place, including screening questions prior to the visit, additional usage of staff PPE, and extensive cleaning of exam room while observing appropriate contact time as indicated for disinfecting solutions.    CC/Reason for Visit: Discuss anxiety  HPI: Danielle [name] is a 25 y.o. female who is coming in today for the above mentioned reasons. Past Medical History is significant for: Generalized anxiety disorder and hyperglycemia.  She is being worked up for the hypoglycemia by endocrinology.  She has documented hypoglycemia in by continuous glucose monitoring.  This happens daily.  She has not received answers as of yet.  She was started on Zoloft 25 mg daily for her anxiety.  Has noted significant improvement and would like refills today.  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   Past Medical/Surgical History: Past Medical History:  Diagnosis Date   Anxiety    GAD (generalized anxiety disorder)    Hypoglycemia    Medical history non-contributory     Past Surgical History:  Procedure Laterality Date   NO PAST SURGERIES      Social History:  reports that she has never smoked. She has never used smokeless tobacco. She reports current alcohol use of about 1.0 standard drink per week. She reports that she does not use drugs.  Allergies: No Known Allergies  Family History:  Family History  Problem Relation Age of Onset   Skin cancer Mother    Depression Father    Other Other      Current Outpatient Medications:    Continuous Blood Gluc Sensor (DEXCOM G6 SENSOR) MISC, 1 Device by Does not apply route See admin instructions. Change every 10 days, Disp: 9 each, Rfl: 3   sertraline (ZOLOFT) 25 MG tablet, TAKE 1 TABLET(25 MG) BY MOUTH DAILY, Disp: 90 tablet, Rfl: 0  Review of Systems:  Constitutional: Denies fever,  chills, diaphoresis, appetite change and fatigue.  HEENT: Denies photophobia, eye pain, redness, hearing loss, ear pain, congestion, sore throat, rhinorrhea, sneezing, mouth sores, trouble swallowing, neck pain, neck stiffness and tinnitus.   Respiratory: Denies SOB, DOE, cough, chest tightness,  and wheezing.   Cardiovascular: Denies chest pain, palpitations and leg swelling.  Gastrointestinal: Denies nausea, vomiting, abdominal pain, diarrhea, constipation, blood in stool and abdominal distention.  Genitourinary: Denies dysuria, urgency, frequency, hematuria, flank pain and difficulty urinating.  Endocrine: Denies: hot or cold intolerance, sweats, changes in hair or nails, polyuria, polydipsia. Musculoskeletal: Denies myalgias, back pain, joint swelling, arthralgias and gait problem.  Skin: Denies pallor, rash and wound.  Neurological: Denies dizziness, seizures, syncope, weakness, light-headedness, numbness and headaches.  Hematological: Denies adenopathy. Easy bruising, personal or family bleeding history  Psychiatric/Behavioral: Denies suicidal ideation, mood changes, confusion, nervousness, sleep disturbance and agitation    Physical Exam: Vitals:   12/08/20 1129  BP: 102/64  Pulse: 72  Temp: (!) 97.5 F (36.4 C)  TempSrc: Oral  SpO2: 99%  Weight: 147 lb 12.8 oz (67 kg)    Body mass index is 24.6 kg/m.   Constitutional: NAD, calm, comfortable Eyes: PERRL, lids and conjunctivae normal ENMT: Mucous membranes are moist.  Cardiovascular: Regular rate and rhythm, no murmurs / rubs / gallops. No extremity edema.  Neurologic: Grossly intact and nonfocal Psychiatric: Normal judgment and insight. Alert and oriented x 3. Normal mood.    Impression and Plan:  GAD (generalized anxiety disorder) -Well-controlled on sertraline 25 mg daily, refilled today.  Hypoglycemia -Advised continue follow-up with endocrinology.  Time spent: 22 minutes reviewing chart, interviewing and  examining patient and formulating plan of care.     Lelon Frohlich, MD [facility]

## 2020-12-09 ENCOUNTER — Telehealth: Payer: Self-pay

## 2020-12-09 NOTE — Telephone Encounter (Signed)
Danielle [name] is seeking an second opinion. She would like to see another provider in the office before the [facility] referral scheduled.    Call back phone [phone].

## 2020-12-20 ENCOUNTER — Telehealth: Payer: BC Managed Care – PPO | Admitting: Nurse Practitioner

## 2020-12-20 DIAGNOSIS — Z20828 Contact with and (suspected) exposure to other viral communicable diseases: Secondary | ICD-10-CM

## 2020-12-20 DIAGNOSIS — R6889 Other general symptoms and signs: Secondary | ICD-10-CM

## 2020-12-20 MED ORDER — OSELTAMIVIR PHOSPHATE 75 MG PO CAPS: 75.0000 mg | ORAL_CAPSULE | Freq: Two times a day (BID) | ORAL | 0 refills | Status: AC

## 2020-12-20 NOTE — Progress Notes (Signed)
E visit for Flu like symptoms   We are sorry that you are not feeling well.  Here is how we plan to help! Based on what you have shared with me it looks like you may have a respiratory virus that may be influenza.  Influenza or "the flu" is   an infection caused by a respiratory virus. The flu virus is highly contagious and persons who did not receive their yearly flu vaccination may "catch" the flu from close contact.  We have anti-viral medications to treat the viruses that cause this infection. They are not a "cure" and only shorten the course of the infection. These prescriptions are most effective when they are given within the first 2 days of "flu" symptoms. Antiviral medication are indicated if you have a high risk of complications from the flu. You should  also consider an antiviral medication if you are in close contact with someone who is at risk. These medications can help patients avoid complications from the flu  but have side effects that you should know. Possible side effects from Tamiflu or oseltamivir include nausea, vomiting, diarrhea, dizziness, headaches, eye redness, sleep problems or other respiratory symptoms. You should not take Tamiflu if you have an allergy to oseltamivir or any to the ingredients in Tamiflu.  Based upon your symptoms and potential risk factors I have prescribed Oseltamivir (Tamiflu).  It has been sent to your designated pharmacy.  You will take one 75 mg capsule orally twice a day for the next 5 days.  ANYONE WHO HAS FLU SYMPTOMS SHOULD: Stay home. The flu is highly contagious and going out or to work exposes others! Be sure to drink plenty of fluids. Water is fine as well as fruit juices, sodas and electrolyte beverages. You may want to stay away from caffeine or alcohol. If you are nauseated, try taking small sips of liquids. How do you know if you are getting enough fluid? Your urine should be a pale yellow or almost colorless. Get rest. Taking a steamy  shower or using a humidifier may help nasal congestion and ease sore throat pain. Using a saline nasal spray works much the same way. Cough drops, hard candies and sore throat lozenges may ease your cough. Line up a caregiver. Have someone check on you regularly.   GET HELP RIGHT AWAY IF: You cannot keep down liquids or your medications. You become short of breath Your fell like you are going to pass out or loose consciousness. Your symptoms persist after you have completed your treatment plan MAKE SURE YOU  Understand these instructions. Will watch your condition. Will get help right away if you are not doing well or get worse.  Your e-visit answers were reviewed by a board certified advanced clinical practitioner to complete your personal care plan.  Depending on the condition, your plan could have included both over the counter or prescription medications.  If there is a problem please reply  once you have received a response from your provider.  Your safety is important to us.  If you have drug allergies check your prescription carefully.    You can use MyChart to ask questions about today's visit, request a non-urgent call back, or ask for a work or school excuse for 24 hours related to this e-Visit. If it has been greater than 24 hours you will need to follow up with your provider, or enter a new e-Visit to address those concerns.  You will get an e-mail in the next   two days asking about your experience.  I hope that your e-visit has been valuable and will speed your recovery. Thank you for using e-visits.   I have spent at least 5 minutes reviewing and documenting in the patient's chart.

## 2021-01-14 ENCOUNTER — Encounter: Payer: Self-pay | Admitting: Internal Medicine

## 2021-01-18 ENCOUNTER — Ambulatory Visit: Payer: BC Managed Care – PPO | Admitting: Endocrinology

## 2021-02-24 ENCOUNTER — Encounter: Payer: Self-pay | Admitting: Endocrinology

## 2021-02-24 ENCOUNTER — Ambulatory Visit (INDEPENDENT_AMBULATORY_CARE_PROVIDER_SITE_OTHER): Payer: BC Managed Care – PPO | Admitting: Endocrinology

## 2021-02-24 ENCOUNTER — Other Ambulatory Visit: Payer: Self-pay

## 2021-02-24 VITALS — BP 110/62 | HR 86 | Ht 65.0 in | Wt 145.2 lb

## 2021-02-24 DIAGNOSIS — E162 Hypoglycemia, unspecified: Secondary | ICD-10-CM | POA: Diagnosis not present

## 2021-02-24 MED ORDER — PIOGLITAZONE HCL 15 MG PO TABS: 7.5000 mg | ORAL_TABLET | Freq: Every day | ORAL | 5 refills | Status: AC

## 2021-02-24 MED ORDER — FREESTYLE LIBRE 2 SENSOR MISC: 1.0000 | 3 refills | Status: AC

## 2021-02-24 NOTE — Patient Instructions (Addendum)
I have sent a prescription to your pharmacy, for the pioglitazone.    Please come back for a follow-up appointment in 6 months.

## 2021-02-24 NOTE — Progress Notes (Signed)
Subjective:    Patient ID: Danielle [name], female    DOB: [birth year], 26 y.o.   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  She has postprandial hypoglycemia approx BIW.  She is unable to say why this happens.  She carries glucose tablets, which help.   Past Medical History:  Diagnosis Date   Anxiety    GAD (generalized anxiety disorder)    Hypoglycemia    Medical history non-contributory     Past Surgical History:  Procedure Laterality Date   NO PAST SURGERIES      Social History   Socioeconomic History   Marital status: Married    Spouse name: Not on file   Number of children: Not on file   Years of education: Not on file   Highest education level: Some college, no degree  Occupational History   Not on file  Tobacco Use   Smoking status: Never   Smokeless tobacco: Never  Substance and Sexual Activity   Alcohol use: Yes    Alcohol/week: 1.0 standard drink    Types: 1 Glasses of wine per week    Comment: occasional   Drug use: No   Sexual activity: Yes    Birth control/protection: None    Comment: last intercourse yesterday   Other Topics Concern   Not on file  Social History Narrative   Updated 05/21/15   Work or School: [employer], 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; working as Physicist, medical Situation: lives with mother most of the time      Spiritual Beliefs: none      Lifestyle: get regular exercise, diet is healthy         Social Determinants of Radio broadcast assistant Strain: Low Risk    Difficulty of Paying Living Expenses: Not very hard  Food Insecurity: No Food Insecurity   Worried About Charity fundraiser in the Last Year: Never true   [place] in the Last Year: Never true  Transportation Needs: No Transportation Needs   Lack of Transportation (Medical): No   Lack of Transportation  (Non-Medical): No  Physical Activity: Sufficiently Active   Days of Exercise per Week: 4 days   Minutes of Exercise per Session: 40 min  Stress: No Stress Concern Present   Feeling of Stress : Only a little  Social Connections: Moderately Integrated   Frequency of Communication with Friends and Family: More than three times a week   Frequency of Social Gatherings with Friends and Family: Once a week   Attends Religious Services: More than 4 times per year   Active Member of [employer] or Organizations: No   Attends Music therapist: Not on file   Marital Status: Married  Human resources officer Violence: Not on file    Current Outpatient Medications on File Prior to Visit  Medication Sig Dispense Refill   sertraline (ZOLOFT) 25 MG tablet TAKE 1 TABLET(25 MG) BY MOUTH DAILY 90 tablet 1   No current facility-administered medications on file prior to visit.    No Known Allergies  Family History  Problem Relation Age of Onset   Skin cancer Mother    Depression Father    Other Other     BP 110/62    Pulse 86    Ht 5\' 5"  (1.651 m)  Wt 145 lb 3.2 oz (65.9 kg)    SpO2 97%    BMI 24.16 kg/m    Review of Systems     Objective:   Physical Exam       Assessment & Plan:  Reactive hypoglycemia: uncontrolled.    Patient Instructions  I have sent a prescription to your pharmacy, for the pioglitazone.    Please come back for a follow-up appointment in 6 months.

## 2021-02-25 ENCOUNTER — Telehealth: Payer: Self-pay

## 2021-02-25 NOTE — Telephone Encounter (Signed)
Patient's insurance company is requesting a prior authorization for the [employer].  Thank you Sireen Halk, RMA

## 2021-02-26 ENCOUNTER — Other Ambulatory Visit (HOSPITAL_COMMUNITY): Payer: Self-pay

## 2021-02-26 MED ORDER — DEXCOM G6 RECEIVER DEVI: 1.0000 | Freq: Once | 1 refills | Status: AC

## 2021-02-26 MED ORDER — DEXCOM G6 SENSOR MISC: 1.0000 | 3 refills | Status: AC

## 2021-02-26 MED ORDER — DEXCOM G6 TRANSMITTER MISC: 1.0000 | Freq: Once | 1 refills | Status: AC

## 2021-03-04 ENCOUNTER — Encounter: Payer: Self-pay | Admitting: Endocrinology

## 2021-03-05 ENCOUNTER — Other Ambulatory Visit: Payer: Self-pay | Admitting: Endocrinology

## 2021-03-05 DIAGNOSIS — E162 Hypoglycemia, unspecified: Secondary | ICD-10-CM

## 2021-03-14 ENCOUNTER — Encounter (HOSPITAL_BASED_OUTPATIENT_CLINIC_OR_DEPARTMENT_OTHER): Payer: Self-pay | Admitting: Emergency Medicine

## 2021-03-14 ENCOUNTER — Other Ambulatory Visit: Payer: Self-pay

## 2021-03-14 ENCOUNTER — Emergency Department (HOSPITAL_BASED_OUTPATIENT_CLINIC_OR_DEPARTMENT_OTHER): Payer: BC Managed Care – PPO

## 2021-03-14 ENCOUNTER — Emergency Department (HOSPITAL_BASED_OUTPATIENT_CLINIC_OR_DEPARTMENT_OTHER)
Admission: EM | Admit: 2021-03-14 | Discharge: 2021-03-14 | Disposition: A | Discharge status: Home / Self Care | Payer: BC Managed Care – PPO | Attending: Emergency Medicine | Admitting: Emergency Medicine

## 2021-03-14 DIAGNOSIS — R112 Nausea with vomiting, unspecified: Secondary | ICD-10-CM | POA: Insufficient documentation

## 2021-03-14 DIAGNOSIS — R197 Diarrhea, unspecified: Secondary | ICD-10-CM | POA: Diagnosis not present

## 2021-03-14 DIAGNOSIS — R1033 Periumbilical pain: Secondary | ICD-10-CM | POA: Insufficient documentation

## 2021-03-14 DIAGNOSIS — R188 Other ascites: Secondary | ICD-10-CM | POA: Diagnosis not present

## 2021-03-14 DIAGNOSIS — R1013 Epigastric pain: Secondary | ICD-10-CM | POA: Insufficient documentation

## 2021-03-14 DIAGNOSIS — R1012 Left upper quadrant pain: Secondary | ICD-10-CM | POA: Diagnosis not present

## 2021-03-14 LAB — PREGNANCY, URINE: Preg Test, Ur: NEGATIVE

## 2021-03-14 LAB — URINALYSIS, ROUTINE W REFLEX MICROSCOPIC
Bilirubin Urine: NEGATIVE
Glucose, UA: NEGATIVE mg/dL
Hgb urine dipstick: NEGATIVE
Ketones, ur: NEGATIVE mg/dL
Nitrite: NEGATIVE
Specific Gravity, Urine: 1.026 (ref 1.005–1.030)
pH: 5.5 (ref 5.0–8.0)

## 2021-03-14 LAB — COMPREHENSIVE METABOLIC PANEL
ALT: 9 U/L (ref 0–44)
AST: 15 U/L (ref 15–41)
Albumin: 4.6 g/dL (ref 3.5–5.0)
Alkaline Phosphatase: 43 U/L (ref 38–126)
Anion gap: 10 (ref 5–15)
BUN: 9 mg/dL (ref 6–20)
CO2: 22 mmol/L (ref 22–32)
Calcium: 9.2 mg/dL (ref 8.9–10.3)
Chloride: 103 mmol/L (ref 98–111)
Creatinine, Ser: 0.7 mg/dL (ref 0.44–1.00)
GFR, Estimated: >60 mL/min (ref >60)
Glucose, Bld: 98 mg/dL (ref 70–99)
Potassium: 3.6 mmol/L (ref 3.5–5.1)
Sodium: 135 mmol/L (ref 135–145)
Total Bilirubin: 0.6 mg/dL (ref 0.3–1.2)
Total Protein: 7.5 g/dL (ref 6.5–8.1)

## 2021-03-14 LAB — LIPASE, BLOOD: Lipase: 13 U/L (ref 11–51)

## 2021-03-14 LAB — CBC WITH DIFFERENTIAL/PLATELET
Abs Immature Granulocytes: 0.01 10*3/uL (ref 0.00–0.07)
Basophils Absolute: 0 10*3/uL (ref 0.0–0.1)
Basophils Relative: 0 %
Eosinophils Absolute: 0 10*3/uL (ref 0.0–0.5)
Eosinophils Relative: 0 %
HCT: 33.2 % — ABNORMAL LOW (ref 36.0–46.0)
Hemoglobin: 10.2 g/dL — ABNORMAL LOW (ref 12.0–15.0)
Immature Granulocytes: 0 %
Lymphocytes Relative: 10 %
Lymphs Abs: 0.5 10*3/uL — ABNORMAL LOW (ref 0.7–4.0)
MCH: 22.3 pg — ABNORMAL LOW (ref 26.0–34.0)
MCHC: 30.7 g/dL (ref 30.0–36.0)
MCV: 72.6 fL — ABNORMAL LOW (ref 80.0–100.0)
Monocytes Absolute: 0.4 10*3/uL (ref 0.1–1.0)
Monocytes Relative: 9 %
Neutro Abs: 4 10*3/uL (ref 1.7–7.7)
Neutrophils Relative %: 81 %
Platelets: 211 10*3/uL (ref 150–400)
RBC: 4.57 MIL/uL (ref 3.87–5.11)
RDW: 15 % (ref 11.5–15.5)
WBC: 4.9 10*3/uL (ref 4.0–10.5)
nRBC: 0 % (ref 0.0–0.2)

## 2021-03-14 MED ORDER — ONDANSETRON HCL 8 MG PO TABS
8.0000 mg | ORAL_TABLET | Freq: Three times a day (TID) | ORAL | 0 refills | Status: AC | PRN
Start: 2021-03-14 — End: ?

## 2021-03-14 MED ORDER — ONDANSETRON HCL 4 MG/2ML IJ SOLN
4.0000 mg | Freq: Once | INTRAMUSCULAR | Status: AC
  Administered 2021-03-14: 4 mg via INTRAVENOUS
  Filled 2021-03-14: qty 2

## 2021-03-14 MED ORDER — IOHEXOL 300 MG/ML  SOLN
100.0000 mL | Freq: Once | INTRAMUSCULAR | Status: AC | PRN
  Administered 2021-03-14: 80 mL via INTRAVENOUS

## 2021-03-14 NOTE — ED Provider Notes (Signed)
[facility] Provider Note: Lowella Dell, MD, FACEP  CSN: [id number] MRN: [id number] ARRIVAL: 03/14/21 at 0411 ROOM: DB010/DB010   CHIEF COMPLAINT  Abdominal Pain   HISTORY OF PRESENT ILLNESS  03/14/21 4:26 AM Danielle [name] is a 26 y.o. female with abdominal pain that started yesterday around noon.  It originated in the periumbilical area and now moves outward and down her flanks bilaterally.  She has taken Pepto-Bismol and Gas-X without relief.  She has had nausea but no vomiting.  She has had several episodes of diarrhea.  She took a gram of Tylenol yesterday evening about 10 PM.  She rates her pain as a 7 out of 10, sharp in nature.  It is worse with movement or palpation.  She had a temperature of 101.2 prior to arrival.   Past Medical History:  Diagnosis Date   GAD (generalized anxiety disorder)    Hypoglycemia     Past Surgical History:  Procedure Laterality Date   NO PAST SURGERIES      Family History  Problem Relation Age of Onset   Skin cancer Mother    Depression Father    Other Other     Social History   Tobacco Use   Smoking status: Never   Smokeless tobacco: Never  Substance Use Topics   Alcohol use: Yes    Alcohol/week: 1.0 standard drink    Types: 1 Glasses of wine per week    Comment: occasional   Drug use: No    Prior to Admission medications   Medication Sig Start Date End Date Taking? Authorizing Provider  Continuous Blood Gluc Sensor (DEXCOM G6 SENSOR) MISC 1 Device by Does not apply route See admin instructions. Change every 10 days 02/26/21   Romero Belling, MD  Continuous Blood Gluc Sensor (FREESTYLE LIBRE 2 SENSOR) MISC 1 Device by Does not apply route every 14 (fourteen) days. 02/24/21   Romero Belling, MD  pioglitazone (ACTOS) 15 MG tablet Take 0.5 tablets (7.5 mg total) by mouth daily. 02/24/21   Romero Belling, MD  sertraline (ZOLOFT) 25 MG tablet TAKE 1 TABLET(25 MG) BY MOUTH DAILY 12/08/20   Philip Aspen, Limmie Patricia, MD     Allergies Patient has no known allergies.   REVIEW OF SYSTEMS  Negative except as noted here or in the History of Present Illness.   PHYSICAL EXAMINATION  Initial Vital Signs Blood pressure 101/80, pulse (!) 102, temperature 98.9 F (37.2 C), temperature source Oral, resp. rate 20, height 5\' 5"  (1.651 m), weight 65.8 kg, last menstrual period 03/04/2021, SpO2 99 %, unknown if currently breastfeeding.  Examination General: Well-developed, well-nourished female in no acute distress; appearance consistent with age of record HENT: normocephalic; atraumatic Eyes: Normal appearance Neck: supple Heart: regular rate and rhythm; tachycardia Lungs: clear to auscultation bilaterally Abdomen: soft; nondistended; periumbilical and left upper quadrant tenderness; bowel sounds present Extremities: No deformity; full range of motion; pulses normal Neurologic: Awake, alert and oriented; motor function intact in all extremities and symmetric; no facial droop Skin: Warm and dry Psychiatric: Normal mood and affect   RESULTS  Summary of this visit's results, reviewed and interpreted by myself:   EKG Interpretation  Date/Time:    Ventricular Rate:    PR Interval:    QRS Duration:   QT Interval:    QTC Calculation:   R Axis:     Text Interpretation:         Laboratory Studies: Results for orders placed or performed during the hospital encounter of  03/14/21 (from the past 24 hour(s))  Urinalysis, Routine w reflex microscopic Urine, Clean Catch     Status: Abnormal   Collection Time: 03/14/21  4:25 AM  Result Value Ref Range   Color, Urine YELLOW YELLOW   APPearance HAZY (A) CLEAR   Specific Gravity, Urine 1.026 1.005 - 1.030   pH 5.5 5.0 - 8.0   Glucose, UA NEGATIVE NEGATIVE mg/dL   Hgb urine dipstick NEGATIVE NEGATIVE   Bilirubin Urine NEGATIVE NEGATIVE   Ketones, ur NEGATIVE NEGATIVE mg/dL   Protein, ur TRACE (A) NEGATIVE mg/dL   Nitrite NEGATIVE NEGATIVE   Leukocytes,Ua  SMALL (A) NEGATIVE   RBC / HPF 0-5 0 - 5 RBC/hpf   WBC, UA 6-10 0 - 5 WBC/hpf   Bacteria, UA RARE (A) NONE SEEN   Squamous Epithelial / LPF 11-20 0 - 5   Mucus PRESENT   Pregnancy, urine     Status: None   Collection Time: 03/14/21  4:25 AM  Result Value Ref Range   Preg Test, Ur NEGATIVE NEGATIVE  CBC with Differential/Platelet     Status: Abnormal   Collection Time: 03/14/21  4:48 AM  Result Value Ref Range   WBC 4.9 4.0 - 10.5 K/uL   RBC 4.57 3.87 - 5.11 MIL/uL   Hemoglobin 10.2 (L) 12.0 - 15.0 g/dL   HCT 33.2 (L) 36.0 - 46.0 %   MCV 72.6 (L) 80.0 - 100.0 fL   MCH 22.3 (L) 26.0 - 34.0 pg   MCHC 30.7 30.0 - 36.0 g/dL   RDW 15.0 11.5 - 15.5 %   Platelets 211 150 - 400 K/uL   nRBC 0.0 0.0 - 0.2 %   Neutrophils Relative % 81 %   Neutro Abs 4.0 1.7 - 7.7 K/uL   Lymphocytes Relative 10 %   Lymphs Abs 0.5 (L) 0.7 - 4.0 K/uL   Monocytes Relative 9 %   Monocytes Absolute 0.4 0.1 - 1.0 K/uL   Eosinophils Relative 0 %   Eosinophils Absolute 0.0 0.0 - 0.5 K/uL   Basophils Relative 0 %   Basophils Absolute 0.0 0.0 - 0.1 K/uL   Immature Granulocytes 0 %   Abs Immature Granulocytes 0.01 0.00 - 0.07 K/uL  Comprehensive metabolic panel     Status: None   Collection Time: 03/14/21  4:48 AM  Result Value Ref Range   Sodium 135 135 - 145 mmol/L   Potassium 3.6 3.5 - 5.1 mmol/L   Chloride 103 98 - 111 mmol/L   CO2 22 22 - 32 mmol/L   Glucose, Bld 98 70 - 99 mg/dL   BUN 9 6 - 20 mg/dL   Creatinine, Ser 0.70 0.44 - 1.00 mg/dL   Calcium 9.2 8.9 - 10.3 mg/dL   Total Protein 7.5 6.5 - 8.1 g/dL   Albumin 4.6 3.5 - 5.0 g/dL   AST 15 15 - 41 U/L   ALT 9 0 - 44 U/L   Alkaline Phosphatase 43 38 - 126 U/L   Total Bilirubin 0.6 0.3 - 1.2 mg/dL   GFR, Estimated >60 >60 mL/min   Anion gap 10 5 - 15  Lipase, blood     Status: None   Collection Time: 03/14/21  4:48 AM  Result Value Ref Range   Lipase 13 11 - 51 U/L   Imaging Studies: CT ABDOMEN PELVIS W CONTRAST  Result Date:  03/14/2021 CLINICAL DATA:  26 year old female with history of acute onset of nonlocalized abdominal pain with nausea and diarrhea. EXAM: CT ABDOMEN  AND PELVIS WITH CONTRAST TECHNIQUE: Multidetector CT imaging of the abdomen and pelvis was performed using the standard protocol following bolus administration of intravenous contrast. RADIATION DOSE REDUCTION: This exam was performed according to the departmental dose-optimization program which includes automated exposure control, adjustment of the mA and/or kV according to patient size and/or use of iterative reconstruction technique. CONTRAST:  25mL OMNIPAQUE IOHEXOL 300 MG/ML  SOLN COMPARISON:  No priors. FINDINGS: Lower chest: Unremarkable. Hepatobiliary: No suspicious cystic or solid hepatic lesions. No intra or extrahepatic biliary ductal dilatation. Gallbladder is normal in appearance. Pancreas: No pancreatic mass. No pancreatic ductal dilatation. No pancreatic or peripancreatic fluid collections or inflammatory changes. Spleen: Unremarkable. Adrenals/Urinary Tract: Bilateral kidneys and adrenal glands are normal in appearance. No hydroureteronephrosis. Urinary bladder is normal in appearance. Stomach/Bowel: The appearance of the stomach is normal. No pathologic dilatation of small bowel or colon. Normal appendix. Vascular/Lymphatic: No significant atherosclerotic disease, aneurysm or dissection noted in the abdominal or pelvic vasculature. Retroaortic left renal vein (normal anatomical variant) incidentally noted. Prominence of the veins of the parametrium and gonadal veins bilaterally (left-greater-than-right), could suggest pelvic congestion syndrome. No lymphadenopathy noted in the abdomen or pelvis. Reproductive: Uterus is unremarkable in appearance. Small thick-walled cystic structure measuring 1.8 cm in the left ovary, most compatible with a degenerating corpus luteum cyst. Well-defined 2.8 x 2.1 cm low-attenuation lesion in the right ovary is compatible  with a dominant follicle. Other: Trace volume of intermediate attenuation fluid in the cul-de-sac, presumably physiologic in this young female patient. Musculoskeletal: There are no aggressive appearing lytic or blastic lesions noted in the visualized portions of the skeleton. IMPRESSION: 1. No acute findings are noted in the abdomen or pelvis to account for the patient's symptoms. However, there is prominence of the veins of the parametrium and the gonadal veins bilaterally, which could be seen in the setting of pelvic congestion syndrome. 2. Small volume of intermediate attenuation fluid in the cul-de-sac, likely physiologic in this young female patient. Electronically Signed   By: Vinnie Langton M.D.   On: 03/14/2021 05:58    ED COURSE and MDM  Nursing notes, initial and subsequent vitals signs, including pulse oximetry, reviewed and interpreted by myself.  Vitals:   03/14/21 0417  BP: 101/80  Pulse: (!) 102  Resp: 20  Temp: 98.9 F (37.2 C)  TempSrc: Oral  SpO2: 99%  Weight: 65.8 kg  Height: 5\' 5"  (1.651 m)   Medications  ondansetron (ZOFRAN) injection 4 mg (4 mg Intravenous Given 03/14/21 0451)  iohexol (OMNIPAQUE) 300 MG/ML solution 100 mL (80 mLs Intravenous Contrast Given 03/14/21 0535)   6:10 AM The patient was advised of reassuring CT findings.  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  Her nausea and vomiting along with fever and low lymphocyte count are suggestive of a viral process.  She was advised diarrhea may occur as part of the disease process.  She should return should her symptoms worsen.  Nausea relieved by Zofran and pain has improved.   PROCEDURES  Procedures   ED DIAGNOSES     ICD-10-CM   1. Epigastric pain  R10.13     2. Nausea and vomiting in adult  R11.2          Shanon Rosser, MD 03/14/21 [phone]

## 2021-03-14 NOTE — ED Triage Notes (Signed)
?  Patient comes in with abdominal pain and flank pain that started yesterday around noon.  Patient states she pain originates at her belly button and moves out and down to her flanks bilaterally.  Patient took pepto-bismol and gas x trying to help with symptoms but did not help.  Nausea with no vomiting.  Several occurrences of diarrhea since then.  Took 1000 mg of tylenol at 2200 last night.  Pain 7/10, sharp. ?

## 2021-03-19 DIAGNOSIS — R635 Abnormal weight gain: Secondary | ICD-10-CM | POA: Diagnosis not present

## 2021-03-19 DIAGNOSIS — Z124 Encounter for screening for malignant neoplasm of cervix: Secondary | ICD-10-CM | POA: Diagnosis not present

## 2021-03-19 DIAGNOSIS — L709 Acne, unspecified: Secondary | ICD-10-CM | POA: Diagnosis not present

## 2021-03-19 DIAGNOSIS — D649 Anemia, unspecified: Secondary | ICD-10-CM | POA: Diagnosis not present

## 2021-03-19 DIAGNOSIS — Z01419 Encounter for gynecological examination (general) (routine) without abnormal findings: Secondary | ICD-10-CM | POA: Diagnosis not present

## 2021-03-19 DIAGNOSIS — Z6823 Body mass index (BMI) 23.0-23.9, adult: Secondary | ICD-10-CM | POA: Diagnosis not present

## 2021-03-22 ENCOUNTER — Encounter: Payer: Self-pay | Admitting: Internal Medicine

## 2021-05-02 ENCOUNTER — Telehealth: Payer: BC Managed Care – PPO | Admitting: Family

## 2021-05-02 DIAGNOSIS — J029 Acute pharyngitis, unspecified: Secondary | ICD-10-CM | POA: Diagnosis not present

## 2021-05-02 MED ORDER — AMOXICILLIN 500 MG PO CAPS: 500.0000 mg | ORAL_CAPSULE | Freq: Two times a day (BID) | ORAL | 0 refills | Status: AC

## 2021-05-02 NOTE — Progress Notes (Signed)

## 2021-05-23 IMAGING — DX DG CHEST 1V PORT
1 series · 1 of 1 positions shown · non-contrast
Comparison: None.

CLINICAL DATA: Shortness of breath with sore throat and headache,
COVID positive

EXAM:
PORTABLE CHEST 1 VIEW

[chest]
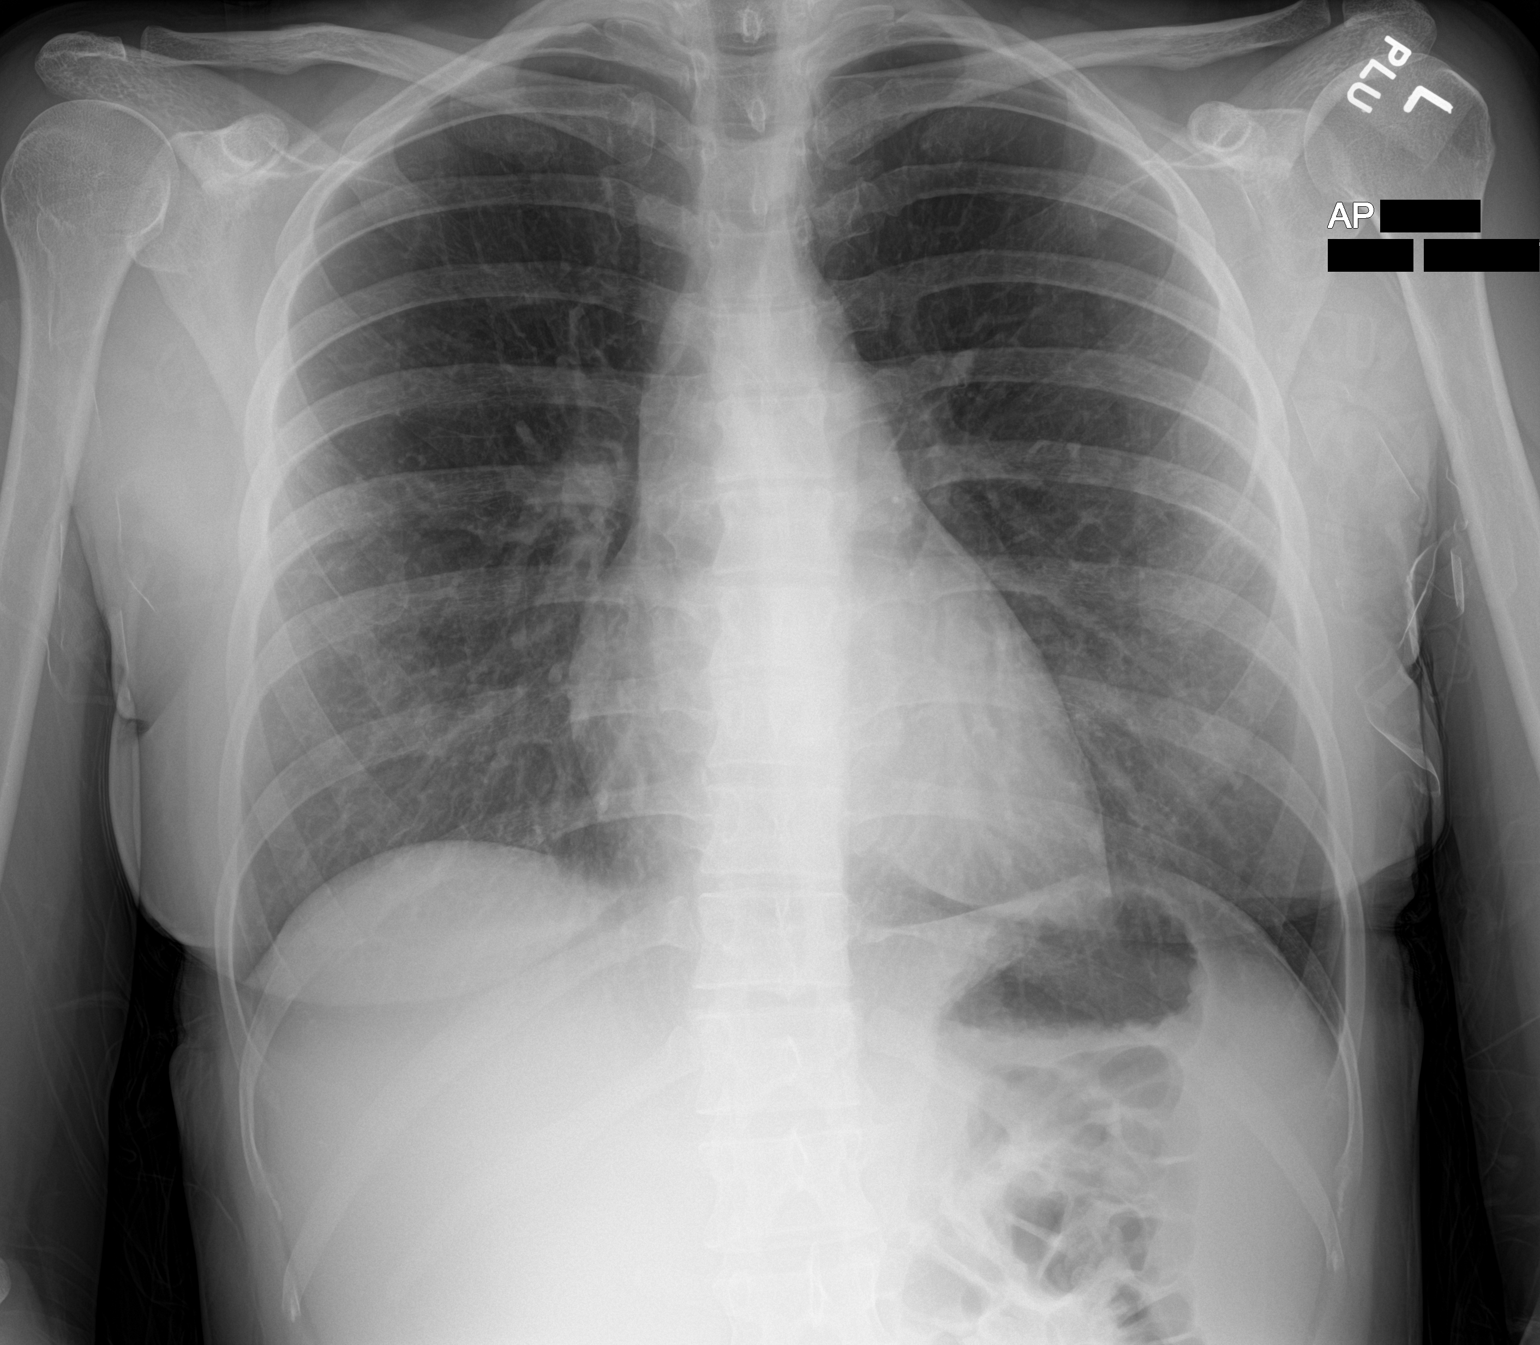

[1 of 1 positions shown; findings below may reference images not displayed]

FINDINGS: The heart size and mediastinal contours are within normal limits.
Perihilar predominant interstitial thickening with peribronchial
cuffing. No focal consolidation. No visible pleural effusion or
pneumothorax. The visualized skeletal structures are unremarkable.
IMPRESSION: Perihilar predominant interstitial thickening with peribronchial
cuffing, findings consistent with atypical/viral infection.

## 2021-06-21 NOTE — Telephone Encounter (Signed)
Close encounter 

## 2022-03-13 IMAGING — CT CT ABD-PELV W/ CM
2 of 4 series · 15 of 46 positions shown, 17 images · IV contrast (APPLIED)
Comparison: No priors.

CLINICAL DATA: 25-year-old female with history of acute onset of
nonlocalized abdominal pain with nausea and diarrhea.

EXAM:
CT ABDOMEN AND PELVIS WITH CONTRAST
TECHNIQUE: Multidetector CT imaging of the abdomen and pelvis was performed
using the standard protocol following bolus administration of
intravenous contrast.

[Series 2: abd pel w · axial · 0.73mm/px · z∈[+601,+1026]mm · 12 of 93 slices shown, 14 images]
[im 4/93  soft-tissue]
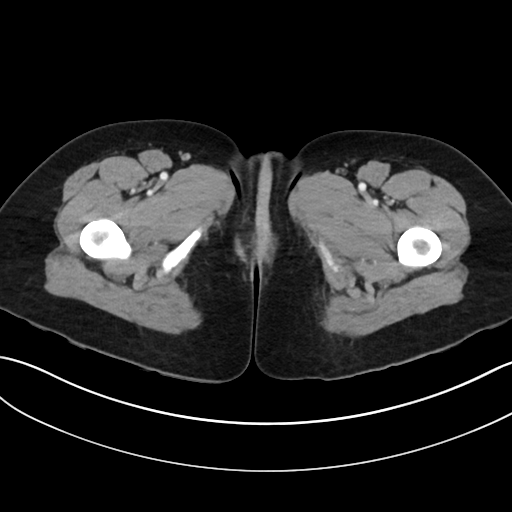
[im 4/93  bone]
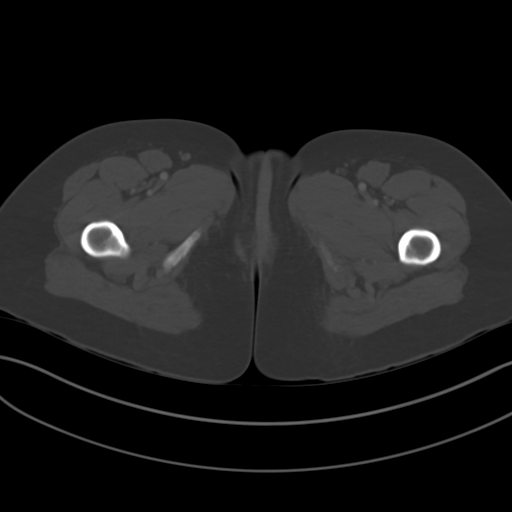
[im 12/93  soft-tissue]
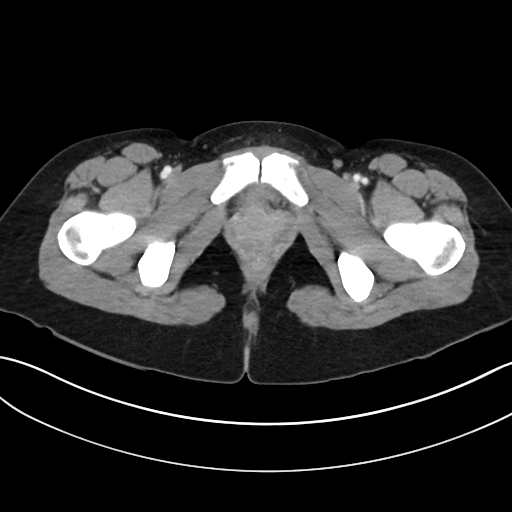
[im 20/93  soft-tissue]
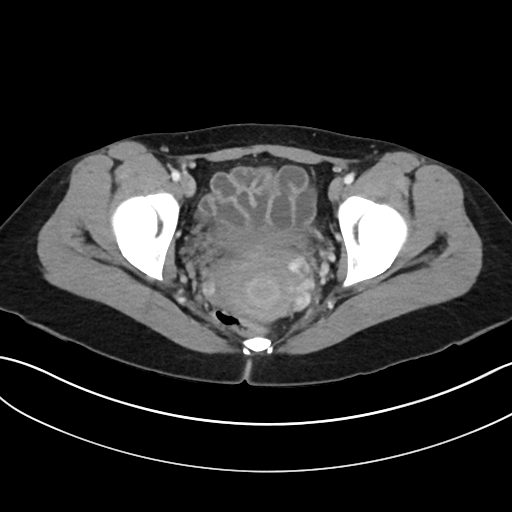
[im 27/93  soft-tissue]
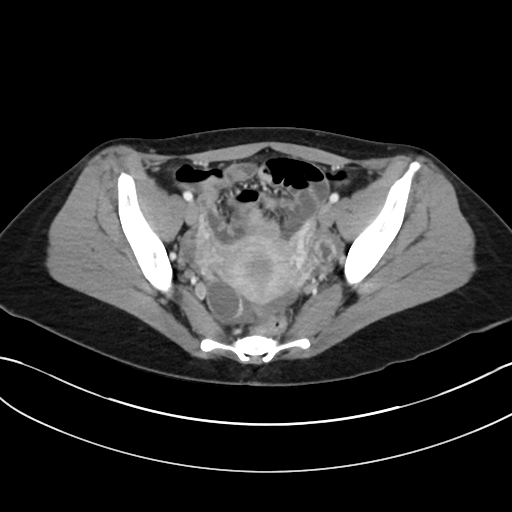
[im 35/93  soft-tissue]
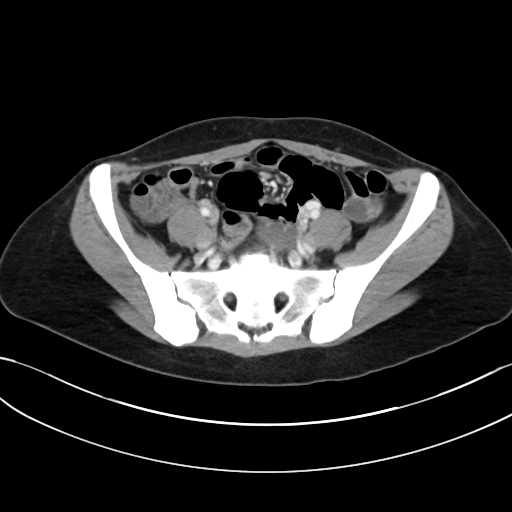
[im 43/93  soft-tissue]
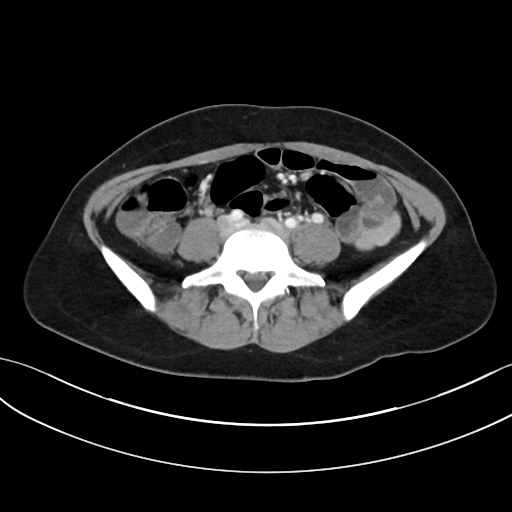
[im 50/93  soft-tissue]
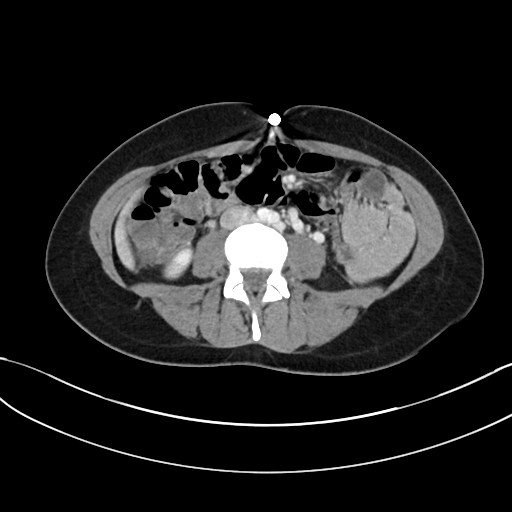
[im 58/93  soft-tissue]
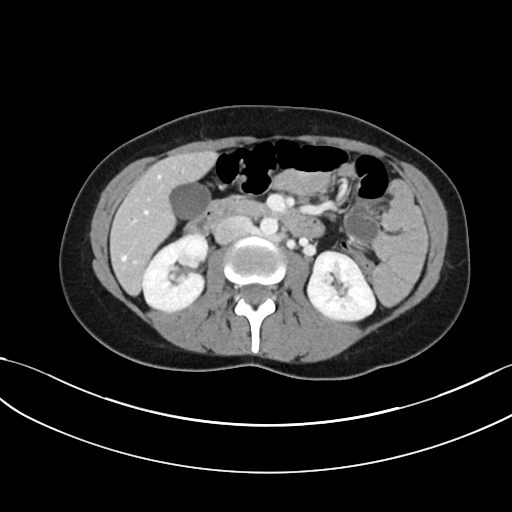
[im 66/93  soft-tissue]
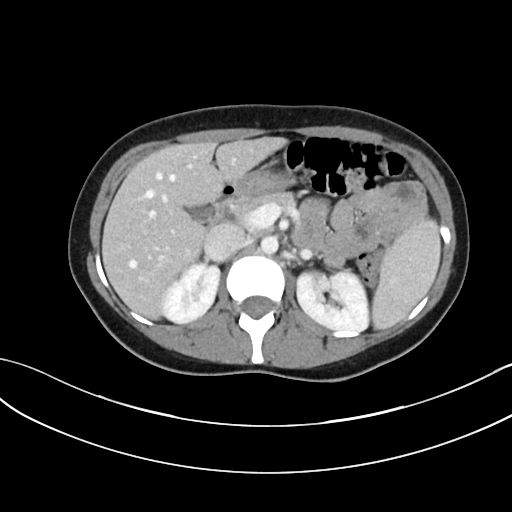
[im 66/93  bone]
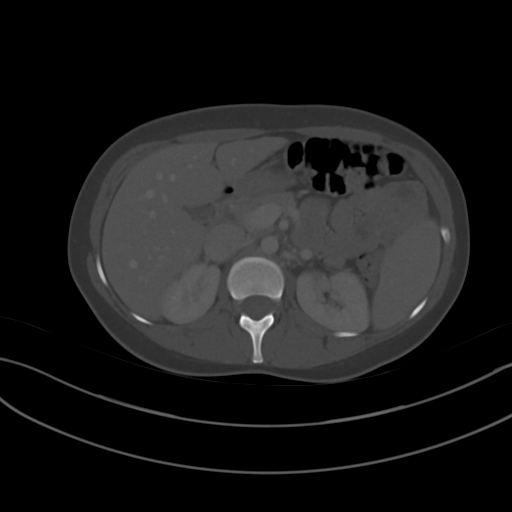
[im 73/93  soft-tissue]
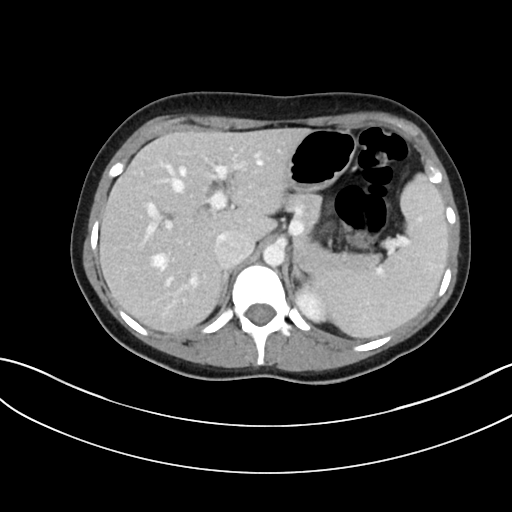
[im 81/93  soft-tissue]
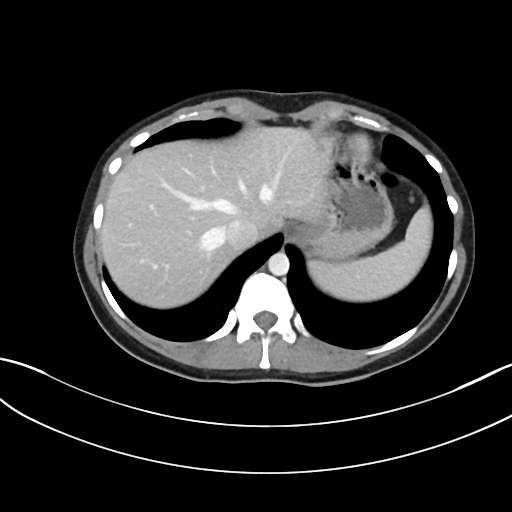
[im 89/93  soft-tissue]
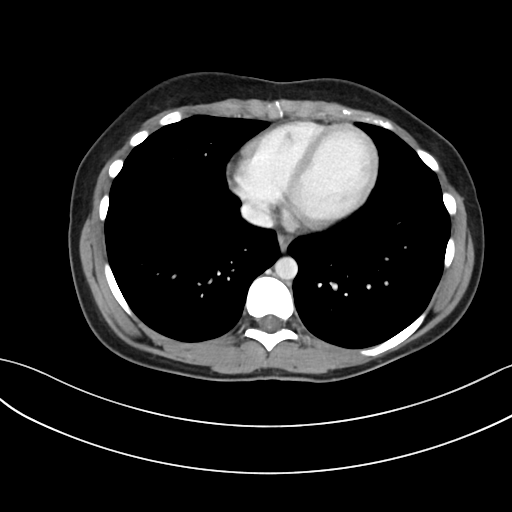

[Series 5: coronal · coronal · 0.69mm/px · 3 of 80 slices shown]
[im 27/80  soft-tissue]
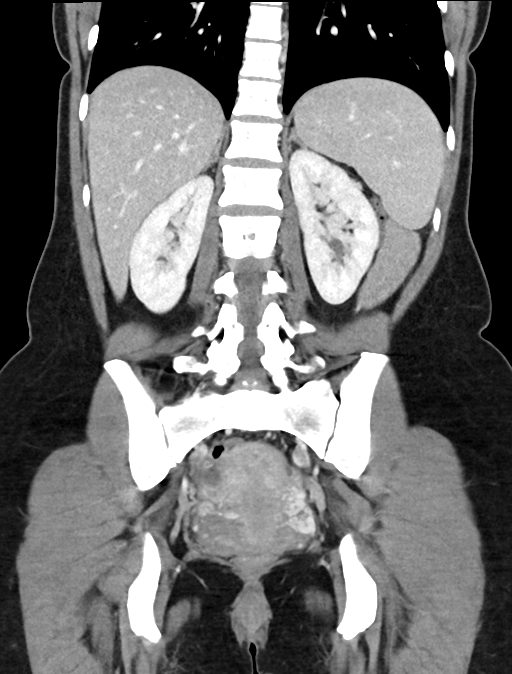
[im 36/80  soft-tissue]
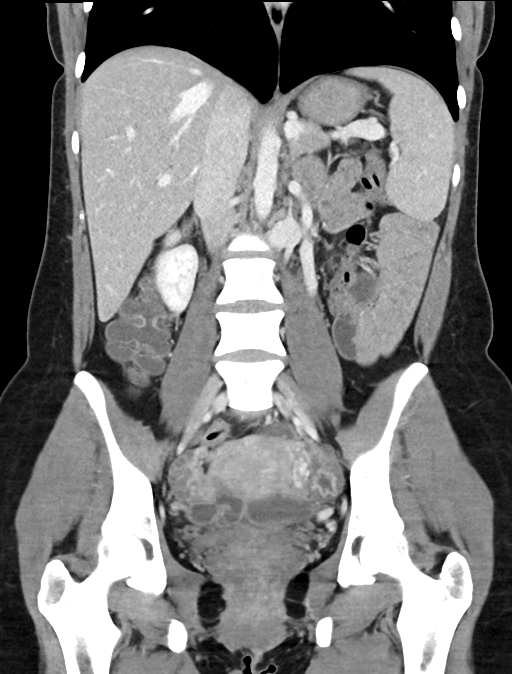
[im 44/80  soft-tissue]
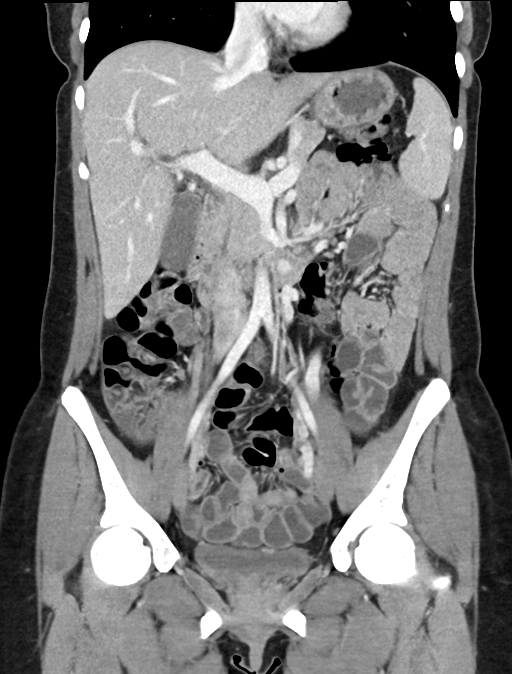

[15 of 46 positions shown; findings below may reference images not displayed]

RADIATION DOSE REDUCTION: This exam was performed according to the
departmental dose-optimization program which includes automated
exposure control, adjustment of the mA and/or kV according to
patient size and/or use of iterative reconstruction technique.

CONTRAST:  80mL OMNIPAQUE IOHEXOL 300 MG/ML  SOLN
FINDINGS: Lower chest: Unremarkable.

Hepatobiliary: No suspicious cystic or solid hepatic lesions. No
intra or extrahepatic biliary ductal dilatation. Gallbladder is
normal in appearance.

Pancreas: No pancreatic mass. No pancreatic ductal dilatation. No
pancreatic or peripancreatic fluid collections or inflammatory
changes.

Spleen: Unremarkable.

Adrenals/Urinary Tract: Bilateral kidneys and adrenal glands are
normal in appearance. No hydroureteronephrosis. Urinary bladder is
normal in appearance.

Stomach/Bowel: The appearance of the stomach is normal. No
pathologic dilatation of small bowel or colon. Normal appendix.

Vascular/Lymphatic: No significant atherosclerotic disease, aneurysm
or dissection noted in the abdominal or pelvic vasculature.
Retroaortic left renal vein (normal anatomical variant) incidentally
noted. Prominence of the veins of the parametrium and gonadal veins
bilaterally (left-greater-than-right), could suggest pelvic
congestion syndrome. No lymphadenopathy noted in the abdomen or
pelvis.

Reproductive: Uterus is unremarkable in appearance. Small
thick-walled cystic structure measuring 1.8 cm in the left ovary,
most compatible with a degenerating corpus luteum cyst. Well-defined
2.8 x 2.1 cm low-attenuation lesion in the right ovary is compatible
with a dominant follicle.

Other: Trace volume of intermediate attenuation fluid in the
cul-de-sac, presumably physiologic in this young female patient.

Musculoskeletal: There are no aggressive appearing lytic or blastic
lesions noted in the visualized portions of the skeleton.
IMPRESSION: 1. No acute findings are noted in the abdomen or pelvis to account
for the patient's symptoms. However, there is prominence of the
veins of the parametrium and the gonadal veins bilaterally, which
could be seen in the setting of pelvic congestion syndrome.
2. Small volume of intermediate attenuation fluid in the cul-de-sac,
likely physiologic in this young female patient.

## 2023-07-31 ENCOUNTER — Encounter: Payer: Self-pay | Admitting: *Deleted

## 2023-07-31 ENCOUNTER — Ambulatory Visit: Payer: Self-pay | Admitting: Internal Medicine

## 2023-07-31 ENCOUNTER — Ambulatory Visit (INDEPENDENT_AMBULATORY_CARE_PROVIDER_SITE_OTHER)

## 2023-07-31 ENCOUNTER — Ambulatory Visit: Admitting: Internal Medicine

## 2023-07-31 VITALS — BP 110/70 | HR 90 | Temp 98.3°F | Ht 65.0 in | Wt 139.8 lb

## 2023-07-31 DIAGNOSIS — D649 Anemia, unspecified: Secondary | ICD-10-CM | POA: Insufficient documentation

## 2023-07-31 DIAGNOSIS — D508 Other iron deficiency anemias: Secondary | ICD-10-CM | POA: Diagnosis not present

## 2023-07-31 DIAGNOSIS — L659 Nonscarring hair loss, unspecified: Secondary | ICD-10-CM | POA: Diagnosis not present

## 2023-07-31 LAB — COMPREHENSIVE METABOLIC PANEL WITH GFR
ALT: 11 U/L (ref 0–35)
AST: 17 U/L (ref 0–37)
Albumin: 5 g/dL (ref 3.5–5.2)
Alkaline Phosphatase: 47 U/L (ref 39–117)
BUN: 11 mg/dL (ref 6–23)
CO2: 23 meq/L (ref 19–32)
Calcium: 9.6 mg/dL (ref 8.4–10.5)
Chloride: 106 meq/L (ref 96–112)
Creatinine, Ser: 0.66 mg/dL (ref 0.40–1.20)
GFR: 118.22 mL/min (ref >60.00)
Glucose, Bld: 73 mg/dL (ref 70–99)
Potassium: 3.9 meq/L (ref 3.5–5.1)
Sodium: 137 meq/L (ref 135–145)
Total Bilirubin: 0.5 mg/dL (ref 0.2–1.2)
Total Protein: 7.7 g/dL (ref 6.0–8.3)

## 2023-07-31 LAB — CBC WITH DIFFERENTIAL/PLATELET
Basophils Absolute: 0.1 K/uL (ref 0.0–0.1)
Basophils Relative: 1.3 % (ref 0.0–3.0)
Eosinophils Absolute: 0 K/uL (ref 0.0–0.7)
Eosinophils Relative: 0.3 % (ref 0.0–5.0)
HCT: 32.5 % — ABNORMAL LOW (ref 36.0–46.0)
Hemoglobin: 10.2 g/dL — ABNORMAL LOW (ref 12.0–15.0)
Lymphocytes Relative: 30.9 % (ref 12.0–46.0)
Lymphs Abs: 1.2 K/uL (ref 0.7–4.0)
MCHC: 31.5 g/dL (ref 30.0–36.0)
MCV: 69.5 fl — ABNORMAL LOW (ref 78.0–100.0)
Monocytes Absolute: 0.3 K/uL (ref 0.1–1.0)
Monocytes Relative: 8.2 % (ref 3.0–12.0)
Neutro Abs: 2.4 K/uL (ref 1.4–7.7)
Neutrophils Relative %: 59.3 % (ref 43.0–77.0)
Platelets: 258 K/uL (ref 150.0–400.0)
RBC: 4.68 Mil/uL (ref 3.87–5.11)
RDW: 16.4 % — ABNORMAL HIGH (ref 11.5–15.5)
WBC: 4 K/uL (ref 4.0–10.5)

## 2023-07-31 LAB — IBC + FERRITIN
Ferritin: 2.7 ng/mL — ABNORMAL LOW (ref 10.0–291.0)
Iron: 26 ug/dL — ABNORMAL LOW (ref 42–145)
Saturation Ratios: 5.6 % — ABNORMAL LOW (ref 20.0–50.0)
TIBC: 466.2 ug/dL — ABNORMAL HIGH (ref 250.0–450.0)
Transferrin: 333 mg/dL (ref 212.0–360.0)

## 2023-07-31 LAB — TSH: TSH: 2.48 u[IU]/mL (ref 0.35–5.50)

## 2023-07-31 LAB — VITAMIN D 25 HYDROXY (VIT D DEFICIENCY, FRACTURES): VITD: 33.58 ng/mL (ref 30.00–100.00)

## 2023-07-31 LAB — VITAMIN B12: Vitamin B-12: 316 pg/mL (ref 211–911)

## 2023-07-31 NOTE — Progress Notes (Signed)
 Established Patient Office Visit     CC/Reason for Visit: Hair loss  HPI: Danielle [name] is a 28 y.o. female who is coming in today for the above mentioned reasons.  She feels like she has lost a lot of hair since April.  Recently she has become aware that both her father and paternal grandmother have Hashimoto's thyroiditis.  Her son also has some thyroid  issues, she is concerned about her thyroid  function.   Past Medical/Surgical History: Past Medical History:  Diagnosis Date   GAD (generalized anxiety disorder)    Hypoglycemia     Past Surgical History:  Procedure Laterality Date   NO PAST SURGERIES      Social History:  reports that she has never smoked. She has never used smokeless tobacco. She reports current alcohol use of about 1.0 standard drink of alcohol per week. She reports that she does not use drugs.  Allergies: No Known Allergies  Family History:  Family History  Problem Relation Age of Onset   Skin cancer Mother    Depression Father    Other Other      Current Outpatient Medications:    Continuous Blood Gluc Sensor (DEXCOM G6 SENSOR) MISC, 1 Device by Does not apply route See admin instructions. Change every 10 days (Patient not taking: Reported on 07/31/2023), Disp: 9 each, Rfl: 3   Continuous Blood Gluc Sensor (FREESTYLE LIBRE 2 SENSOR) MISC, 1 Device by Does not apply route every 14 (fourteen) days. (Patient not taking: Reported on 07/31/2023), Disp: 6 each, Rfl: 3   ondansetron  (ZOFRAN ) 8 MG tablet, Take 1 tablet (8 mg total) by mouth every 8 (eight) hours as needed for nausea or vomiting., Disp: 10 tablet, Rfl: 0   pioglitazone  (ACTOS ) 15 MG tablet, Take 0.5 tablets (7.5 mg total) by mouth daily. (Patient not taking: Reported on 07/31/2023), Disp: 30 tablet, Rfl: 5   sertraline  (ZOLOFT ) 25 MG tablet, TAKE 1 TABLET(25 MG) BY MOUTH DAILY (Patient not taking: Reported on 07/31/2023), Disp: 90 tablet, Rfl: 1  Review of Systems:  Negative unless  indicated in HPI.   Physical Exam: Vitals:   07/31/23 1100  BP: 110/70  Pulse: 90  Temp: 98.3 F (36.8 C)  TempSrc: Oral  SpO2: 98%  Weight: 139 lb 12.8 oz (63.4 kg)  Height: 5' 5 (1.651 m)    Body mass index is 23.26 kg/m.   Physical Exam Vitals reviewed.  Constitutional:      Appearance: Normal appearance.  HENT:     Head: Normocephalic and atraumatic.  Eyes:     Conjunctiva/sclera: Conjunctivae normal.  Cardiovascular:     Rate and Rhythm: Normal rate and regular rhythm.  Pulmonary:     Effort: Pulmonary effort is normal.     Breath sounds: Normal breath sounds.  Skin:    General: Skin is warm and dry.  Neurological:     General: No focal deficit present.     Mental Status: She is alert and oriented to person, place, and time.  Psychiatric:        Mood and Affect: Mood normal.        Behavior: Behavior normal.        Thought Content: Thought content normal.        Judgment: Judgment normal.      Impression and Plan:  Hair loss -     CBC with Differential/Platelet; Future -     Comprehensive metabolic panel with GFR; Future -     VITAMIN  D 25 Hydroxy (Vit-D Deficiency, Fractures); Future -     Vitamin B12; Future -     TSH; Future   - Check TSH, vitamin D  and B12, check for anemia as potential causes for hair loss.  Time spent:30 minutes reviewing chart, interviewing and examining patient and formulating plan of care.     Tully Theophilus Andrews, MD [facility]

## 2023-08-01 ENCOUNTER — Ambulatory Visit: Payer: Self-pay | Admitting: Internal Medicine

## 2023-11-09 ENCOUNTER — Ambulatory Visit: Admitting: Internal Medicine

## 2023-11-29 ENCOUNTER — Ambulatory Visit: Admitting: Internal Medicine

## 2023-11-29 ENCOUNTER — Other Ambulatory Visit

## 2023-11-29 DIAGNOSIS — D508 Other iron deficiency anemias: Secondary | ICD-10-CM

## 2023-11-29 LAB — CBC WITH DIFFERENTIAL/PLATELET
Basophils Absolute: 0 K/uL (ref 0.0–0.1)
Basophils Relative: 1 % (ref 0.0–3.0)
Eosinophils Absolute: 0 K/uL (ref 0.0–0.7)
Eosinophils Relative: 0.9 % (ref 0.0–5.0)
HCT: 38 % (ref 36.0–46.0)
Hemoglobin: 13.3 g/dL (ref 12.0–15.0)
Lymphocytes Relative: 39.2 % (ref 12.0–46.0)
Lymphs Abs: 1.5 K/uL (ref 0.7–4.0)
MCHC: 35.1 g/dL (ref 30.0–36.0)
MCV: 87 fl (ref 78.0–100.0)
Monocytes Absolute: 0.3 K/uL (ref 0.1–1.0)
Monocytes Relative: 7.1 % (ref 3.0–12.0)
Neutro Abs: 2 K/uL (ref 1.4–7.7)
Neutrophils Relative %: 51.8 % (ref 43.0–77.0)
Platelets: 206 K/uL (ref 150.0–400.0)
RBC: 4.37 Mil/uL (ref 3.87–5.11)
RDW: 13.2 % (ref 11.5–15.5)
WBC: 3.9 K/uL — ABNORMAL LOW (ref 4.0–10.5)

## 2023-11-29 LAB — IBC + FERRITIN
Ferritin: 11.6 ng/mL (ref 10.0–291.0)
Iron: 55 ug/dL (ref 42–145)
Saturation Ratios: 17.2 % — ABNORMAL LOW (ref 20.0–50.0)
TIBC: 319.2 ug/dL (ref 250.0–450.0)
Transferrin: 228 mg/dL (ref 212.0–360.0)

## 2023-11-30 ENCOUNTER — Ambulatory Visit: Admitting: Internal Medicine

## 2023-12-29 ENCOUNTER — Telehealth: Admitting: Emergency Medicine

## 2023-12-29 DIAGNOSIS — J019 Acute sinusitis, unspecified: Secondary | ICD-10-CM

## 2023-12-29 DIAGNOSIS — B9789 Other viral agents as the cause of diseases classified elsewhere: Secondary | ICD-10-CM | POA: Diagnosis not present

## 2023-12-29 MED ORDER — IPRATROPIUM BROMIDE 0.03 % NA SOLN: 2.0000 | Freq: Two times a day (BID) | NASAL | 0 refills | Status: AC

## 2023-12-29 MED ORDER — AMOXICILLIN-POT CLAVULANATE 875-125 MG PO TABS: 1.0000 | ORAL_TABLET | Freq: Two times a day (BID) | ORAL | 0 refills | Status: AC

## 2023-12-29 NOTE — Progress Notes (Signed)
 We are sorry that you are not feeling well.  Here is how we plan to help!  Based on what you have shared with me it looks like you have sinusitis.  Sinusitis is inflammation and infection in the sinus cavities of the head.  Based on your presentation I believe you most likely have Acute Viral Sinusitis.This is an infection most likely caused by a virus. There is not specific treatment for viral sinusitis other than to help you with the symptoms until the infection runs its course.  You may use an oral decongestant such as Mucinex D or if you have glaucoma or high blood pressure use plain Mucinex. Saline nasal spray help and can safely be used as often as needed for congestion, I have prescribed: Ipratropium Bromide  nasal spray 0.03% 2 sprays in eah nostril 2-3 times a day  Some authorities believe that zinc sprays or the use of Echinacea may shorten the course of your symptoms.  Sinus infections are not as easily transmitted as other respiratory infection, however we still recommend that you avoid close contact with loved ones, especially the very young and elderly.  Remember to wash your hands thoroughly throughout the day as this is the number one way to prevent the spread of infection!  Giving the upcoming Holiday, I did place a script on file at the pharmacy for an antibiotic -- Augmentin  -- to start if symptoms are not turning the corner over the next 2-3 days or any acutely worsening symptoms after the next 48 hours.   Home Care: Only take medications as instructed by your medical team. Do not take these medications with alcohol. A steam or ultrasonic humidifier can help congestion.  You can place a towel over your head and breathe in the steam from hot water coming from a faucet. Avoid close contacts especially the very young and the elderly. Cover your mouth when you cough or sneeze. Always remember to wash your hands.  Get Help Right Away If: You develop worsening fever or sinus  pain. You develop a severe head ache or visual changes. Your symptoms persist after you have completed your treatment plan.  Make sure you Understand these instructions. Will watch your condition. Will get help right away if you are not doing well or get worse.  Your e-visit answers were reviewed by a board certified advanced clinical practitioner to complete your personal care plan.  Depending on the condition, your plan could have included both over the counter or prescription medications.  If there is a problem please reply  once you have received a response from your provider.  Your safety is important to us .  If you have drug allergies check your prescription carefully.    You can use MyChart to ask questions about todays visit, request a non-urgent call back, or ask for a work or school excuse for 24 hours related to this e-Visit. If it has been greater than 24 hours you will need to follow up with your provider, or enter a new e-Visit to address those concerns.  You will get an e-mail in the next two days asking about your experience.  I hope that your e-visit has been valuable and will speed your recovery. Thank you for using e-visits.  I have spent 5 minutes in review of e-visit questionnaire, review and updating patient chart, medical decision making and response to patient.   Elsie Velma Lunger, PA-C
